# Patient Record
Sex: Male | Born: 1995 | Race: White | Hispanic: No | Marital: Single | Smoking: Former smoker
Health system: Southern US, Community
[De-identification: ages and names within clinical notes are randomized; demographics above are authoritative.]

## PROBLEM LIST (undated history)

## (undated) DIAGNOSIS — K219 Gastro-esophageal reflux disease without esophagitis: Secondary | ICD-10-CM

## (undated) DIAGNOSIS — F419 Anxiety disorder, unspecified: Secondary | ICD-10-CM

## (undated) DIAGNOSIS — F329 Major depressive disorder, single episode, unspecified: Secondary | ICD-10-CM

## (undated) DIAGNOSIS — G4700 Insomnia, unspecified: Secondary | ICD-10-CM

## (undated) DIAGNOSIS — N189 Chronic kidney disease, unspecified: Secondary | ICD-10-CM

## (undated) DIAGNOSIS — F32A Depression, unspecified: Secondary | ICD-10-CM

## (undated) DIAGNOSIS — N200 Calculus of kidney: Secondary | ICD-10-CM

## (undated) HISTORY — DX: Gastro-esophageal reflux disease without esophagitis: K21.9

## (undated) HISTORY — DX: Insomnia, unspecified: G47.00

## (undated) HISTORY — DX: Chronic kidney disease, unspecified: N18.9

---
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
## 2017-02-03 ENCOUNTER — Encounter: Payer: Self-pay | Admitting: Emergency Medicine

## 2017-02-03 ENCOUNTER — Other Ambulatory Visit: Payer: Self-pay

## 2017-02-03 ENCOUNTER — Emergency Department
Admission: EM | Admit: 2017-02-03 | Discharge: 2017-02-03 | Disposition: A | Discharge status: Home / Self Care | Payer: Self-pay | Attending: Emergency Medicine | Admitting: Emergency Medicine

## 2017-02-03 DIAGNOSIS — Z79899 Other long term (current) drug therapy: Secondary | ICD-10-CM | POA: Insufficient documentation

## 2017-02-03 DIAGNOSIS — L0501 Pilonidal cyst with abscess: Secondary | ICD-10-CM | POA: Insufficient documentation

## 2017-02-03 DIAGNOSIS — N189 Chronic kidney disease, unspecified: Secondary | ICD-10-CM | POA: Insufficient documentation

## 2017-02-03 DIAGNOSIS — F17210 Nicotine dependence, cigarettes, uncomplicated: Secondary | ICD-10-CM | POA: Insufficient documentation

## 2017-02-03 MED ORDER — CEPHALEXIN 500 MG PO CAPS
500.0000 mg | ORAL_CAPSULE | Freq: Once | ORAL | Status: AC
  Administered 2017-02-03: 500 mg via ORAL
  Filled 2017-02-03: qty 1

## 2017-02-03 MED ORDER — OXYCODONE-ACETAMINOPHEN 5-325 MG PO TABS: 1.0000 | ORAL_TABLET | ORAL | 0 refills | Status: DC | PRN

## 2017-02-03 MED ORDER — LIDOCAINE HCL (PF) 1 % IJ SOLN
INTRAMUSCULAR | Status: AC
  Administered 2017-02-03: 05:00:00
  Filled 2017-02-03: qty 5

## 2017-02-03 MED ORDER — OXYCODONE-ACETAMINOPHEN 5-325 MG PO TABS
1.0000 | ORAL_TABLET | Freq: Once | ORAL | Status: AC
  Administered 2017-02-03: 1 via ORAL
  Filled 2017-02-03: qty 1

## 2017-02-03 MED ORDER — SULFAMETHOXAZOLE-TRIMETHOPRIM 800-160 MG PO TABS: 1.0000 | ORAL_TABLET | Freq: Two times a day (BID) | ORAL | 0 refills | Status: DC

## 2017-02-03 NOTE — ED Notes (Signed)
Pt states that for past four days his tailbone has been hurting. He has no clue why. Family at bedside.

## 2017-02-03 NOTE — ED Provider Notes (Signed)
[facility] Emergency Department Provider Note    [name] Initiated Contact with Patient 02/03/17 0221     (approximate)  I have reviewed the triage vital signs and the nursing notes.   HISTORY  Chief Complaint Tailbone Pain    HPI Jimmy Richards is a 22 y.o. male low list of chronic medical conditions presents to the emergency department with nontraumatic "tailbone pain" times approximately 4 days.  Patient denies any fever.  Patient denies any history of bilateral cysts or abscess.     Past Medical History:  Diagnosis Date  . Chronic kidney disease     Patient Active Problem List   Diagnosis Date Noted  . Severe episode of recurrent major depressive disorder, without psychotic features (HCC) 09/04/2015  . Generalized anxiety disorder 09/04/2015  . PTSD (post-traumatic stress disorder) 09/04/2015  . Cannabis use disorder, severe, dependence (HCC) 09/04/2015  . Adolescent idiopathic scoliosis of thoracic region 11/10/2014  . Recurrent oral herpes simplex 11/10/2014    History reviewed. No pertinent surgical history.  Prior to Admission medications   Medication Sig Start Date End Date Taking? Authorizing Provider  busPIRone (BUSPAR) 10 MG tablet Take 10 mg by mouth 3 (three) times daily. 09/09/15   Provider, Historical, Jimmy Richards  mirtazapine (REMERON) 30 MG tablet Take 30 mg by mouth. 09/09/15   Provider, Historical, Jimmy Richards  oxyCODONE-acetaminophen (ROXICET) 5-325 MG tablet Take 1 tablet by mouth every 4 (four) hours as needed for severe pain. 02/03/17   Jimmy Richards, Jimmy Richards  QUEtiapine (SEROQUEL) 100 MG tablet Take 100 mg by mouth at bedtime. 09/09/15   Provider, Historical, Jimmy Richards  sulfamethoxazole-trimethoprim (BACTRIM DS,SEPTRA DS) 800-160 MG tablet Take 1 tablet by mouth 2 (two) times daily. 02/03/17   Jimmy Richards, Jimmy Richards    Allergies Patient has no known allergies.  Family History  Problem Relation Age of Onset  . Anxiety disorder Mother   .  Depression Mother   . Anxiety disorder Father   . Depression Father   . Anxiety disorder Sister   . Depression Sister   . Anxiety disorder Brother   . Depression Brother   . Anxiety disorder Sister   . Depression Sister   . Anxiety disorder Sister   . Depression Sister   . Drug abuse Sister   . Anxiety disorder Sister   . Depression Sister   . Anxiety disorder Sister   . Depression Sister   . Anxiety disorder Sister   . Depression Sister   . Anxiety disorder Sister   . Depression Sister   . Anxiety disorder Sister   . Depression Sister     Social History Social History   Tobacco Use  . Smoking status: Richards Every Day Smoker    Packs/day: 1.00    Years: 10.00    Pack years: 10.00    Types: Cigarettes  . Smokeless tobacco: Never Used  Substance Use Topics  . Alcohol use: Yes    Alcohol/week: 0.0 - 2.4 oz  . Drug use: Yes    Types: Marijuana    Comment: LAST USED LAST NIGHT    Review of Systems Constitutional: No fever/chills Eyes: No visual changes. ENT: No sore throat. Cardiovascular: Denies chest pain. Respiratory: Denies shortness of breath. Gastrointestinal: No abdominal pain.  No nausea, no vomiting.  No diarrhea.  No constipation. Genitourinary: Negative for dysuria. Musculoskeletal: Negative for neck pain.  Negative for back pain.  Positive for "tailbone" pain Integumentary: Negative for rash. Neurological: Negative for headaches, focal weakness  or numbness.   ____________________________________________   PHYSICAL EXAM:  VITAL SIGNS: ED Triage Vitals  Enc Vitals Group     BP 02/03/17 0135 123/78     Pulse Rate 02/03/17 0135 88     Resp 02/03/17 0135 16     Temp 02/03/17 0135 97.7 F (36.5 C)     Temp Source 02/03/17 0135 Oral     SpO2 02/03/17 0135 99 %     Weight 02/03/17 0133 68 kg (150 lb)     Height 02/03/17 0133 1.829 m (6')     Head Circumference --      Peak Flow --      Pain Score 02/03/17 0133 8     Pain Loc --      Pain  Edu? --      Excl. in GC? --     Constitutional: Alert and oriented. Well appearing and in no acute distress. Eyes: Conjunctivae are normal.  Head: Atraumatic. Mouth/Throat: Mucous membranes are moist.  Oropharynx non-erythematous. Neck: No stridor.   Cardiovascular: Normal rate, regular rhythm. Good peripheral circulation. Grossly normal heart sounds. Respiratory: Normal respiratory effort.  No retractions. Lungs CTAB. Gastrointestinal: Soft and nontender. No distention.  Musculoskeletal: No lower extremity tenderness nor edema. No gross deformities of extremities.  Indurated area noted with overlying erythema consistent with pilonidal cyst/abscess tender to palpation Neurologic:  Normal speech and language. No gross focal neurologic deficits are appreciated.  Skin:  Skin is warm, dry and intact. No rash noted. Psychiatric: Anxious affect.     Marland Kitchen..Incision and Drainage Date/Time: 02/03/2017 6:50 AM Performed by: Jimmy CurrentBrown, Jimmy Richards, Jimmy Richards Authorized by: Jimmy CurrentBrown, Jimmy Richards, Jimmy Richards   Consent:    Consent obtained:  Verbal   Consent given by:  Patient   Risks discussed:  Incomplete drainage, bleeding and pain   Alternatives discussed:  Alternative treatment Location:    Type:  Abscess   Location:  Anogenital   Anogenital location:  Pilonidal Pre-procedure details:    Skin preparation:  Betadine Anesthesia (see MAR for exact dosages):    Anesthesia method:  Topical application and local infiltration   Topical anesthetic:  Benzocaine gel   Local anesthetic:  Lidocaine 1% w/o epi Procedure type:    Complexity:  Simple Procedure details:    Needle aspiration: no     Incision types:  Cruciate   Scalpel blade:  11   Drainage:  Purulent and serosanguinous   Drainage amount:  Scant   Packing materials:  None Post-procedure details:    Patient tolerance of procedure:  Tolerated well, no immediate complications     ____________________________________________   INITIAL IMPRESSION /  ASSESSMENT AND PLAN / ED COURSE  As part of my medical decision making, I reviewed the following data within the electronic MEDICAL RECORD [age] year old male presenting to the emergency department above-stated history and physical exam consistent with pilonidal cyst/abscess.  I&D performed with scant pus/serosanguineous fluid expressed ____________________________________________  FINAL CLINICAL IMPRESSION(S) / ED DIAGNOSES  Final diagnoses:  Pilonidal abscess     MEDICATIONS GIVEN DURING THIS VISIT:  Medications  cephALEXin (KEFLEX) capsule 500 mg (500 mg Oral Given 02/03/17 0335)  oxyCODONE-acetaminophen (PERCOCET/ROXICET) 5-325 MG per tablet 1 tablet (1 tablet Oral Given 02/03/17 0335)  lidocaine (PF) (XYLOCAINE) 1 % injection (  Given 02/03/17 0454)     ED Discharge Orders        Ordered    sulfamethoxazole-trimethoprim (BACTRIM DS,SEPTRA DS) 800-160 MG tablet  2 times daily     02/03/17 0502  oxyCODONE-acetaminophen (ROXICET) 5-325 MG tablet  Every 4 hours PRN     02/03/17 0502       Note:  This document was prepared using Dragon voice recognition software and may include unintentional dictation errors.    Jimmy Richards, Jimmy Richards 02/03/17 [phone]

## 2017-02-03 NOTE — ED Triage Notes (Signed)
Patient ambulatory to triage with steady gait, without difficulty or distress noted; pt c/o pain to tailbone; denies any injury, denies any abscess, denies hx of same, denies any accomp symptoms

## 2017-09-13 ENCOUNTER — Emergency Department
Admission: EM | Admit: 2017-09-13 | Discharge: 2017-09-13 | Disposition: A | Discharge status: Home / Self Care | Payer: Self-pay | Attending: Emergency Medicine | Admitting: Emergency Medicine

## 2017-09-13 DIAGNOSIS — K2901 Acute gastritis with bleeding: Secondary | ICD-10-CM

## 2017-09-13 DIAGNOSIS — R1110 Vomiting, unspecified: Secondary | ICD-10-CM | POA: Insufficient documentation

## 2017-09-13 DIAGNOSIS — F17210 Nicotine dependence, cigarettes, uncomplicated: Secondary | ICD-10-CM | POA: Insufficient documentation

## 2017-09-13 DIAGNOSIS — F10920 Alcohol use, unspecified with intoxication, uncomplicated: Secondary | ICD-10-CM

## 2017-09-13 DIAGNOSIS — T5191XA Toxic effect of unspecified alcohol, accidental (unintentional), initial encounter: Secondary | ICD-10-CM | POA: Insufficient documentation

## 2017-09-13 HISTORY — DX: Anxiety disorder, unspecified: F41.9

## 2017-09-13 HISTORY — DX: Major depressive disorder, single episode, unspecified: F32.9

## 2017-09-13 HISTORY — DX: Depression, unspecified: F32.A

## 2017-09-13 LAB — COMPREHENSIVE METABOLIC PANEL
ALBUMIN: 5 g/dL (ref 3.5–5.0)
ALT: 17 U/L (ref 0–44)
AST: 21 U/L (ref 15–41)
Alkaline Phosphatase: 50 U/L (ref 38–126)
Anion gap: 11 (ref 5–15)
BUN: 16 mg/dL (ref 6–20)
CHLORIDE: 103 mmol/L (ref 98–111)
CO2: 27 mmol/L (ref 22–32)
CREATININE: 0.66 mg/dL (ref 0.61–1.24)
Calcium: 9.4 mg/dL (ref 8.9–10.3)
GFR calc non Af Amer: >60 mL/min (ref >60)
GLUCOSE: 102 mg/dL — AB (ref 70–99)
Potassium: 3.5 mmol/L (ref 3.5–5.1)
SODIUM: 141 mmol/L (ref 135–145)
Total Bilirubin: 1 mg/dL (ref 0.3–1.2)
Total Protein: 8.5 g/dL — ABNORMAL HIGH (ref 6.5–8.1)

## 2017-09-13 LAB — CBC WITH DIFFERENTIAL/PLATELET
BASOS ABS: 0.1 10*3/uL (ref 0–0.1)
BASOS PCT: 1 %
EOS ABS: 0 10*3/uL (ref 0–0.7)
Eosinophils Relative: 1 %
HCT: 48.4 % (ref 40.0–52.0)
HEMOGLOBIN: 16.8 g/dL (ref 13.0–18.0)
Lymphocytes Relative: 17 %
Lymphs Abs: 1.1 10*3/uL (ref 1.0–3.6)
MCH: 31.4 pg (ref 26.0–34.0)
MCHC: 34.6 g/dL (ref 32.0–36.0)
MCV: 90.8 fL (ref 80.0–100.0)
Monocytes Absolute: 0.8 10*3/uL (ref 0.2–1.0)
Monocytes Relative: 12 %
NEUTROS PCT: 69 %
Neutro Abs: 4.3 10*3/uL (ref 1.4–6.5)
Platelets: 223 10*3/uL (ref 150–440)
RBC: 5.33 MIL/uL (ref 4.40–5.90)
RDW: 14.4 % (ref 11.5–14.5)
WBC: 6.2 10*3/uL (ref 3.8–10.6)

## 2017-09-13 LAB — ETHANOL: ALCOHOL ETHYL (B): 76 mg/dL — AB (ref <10)

## 2017-09-13 MED ORDER — PANTOPRAZOLE SODIUM 40 MG PO TBEC
40.0000 mg | DELAYED_RELEASE_TABLET | Freq: Every day | ORAL | Status: DC
  Administered 2017-09-13: 40 mg via ORAL
  Filled 2017-09-13: qty 1

## 2017-09-13 MED ORDER — SODIUM CHLORIDE 0.9 % IV BOLUS
1000.0000 mL | Freq: Once | INTRAVENOUS | Status: AC
  Administered 2017-09-13: 1000 mL via INTRAVENOUS

## 2017-09-13 MED ORDER — OMEPRAZOLE 40 MG PO CPDR: 40.0000 mg | DELAYED_RELEASE_CAPSULE | Freq: Every day | ORAL | 0 refills | Status: DC

## 2017-09-13 MED ORDER — ONDANSETRON HCL 4 MG/2ML IJ SOLN
4.0000 mg | Freq: Once | INTRAMUSCULAR | Status: AC
  Administered 2017-09-13: 4 mg via INTRAVENOUS
  Filled 2017-09-13: qty 2

## 2017-09-13 NOTE — ED Triage Notes (Signed)
Patient to ED via [facility] c/o nausea and vomiting induced by EtOH. Per EMS patient was at home drinking moonshine last night; patient reports waking up still feeling drunk and has thrown up "twice" and saw "streaks of blood". Patient is alert and oriented x4, denies pain, respirations even and unlabored, in no acute distress at this time.

## 2017-09-13 NOTE — ED Notes (Signed)
Patient reports feeling "much better" and denies pain and nausea at this time. MD Schaevitz made aware.

## 2017-09-13 NOTE — ED Provider Notes (Signed)
[facility] Emergency Department Provider Note ____________________________________________   First MD Initiated Contact with Patient 09/13/17 [phone]     (approximate)  I have reviewed the triage vital signs and the nursing notes.   HISTORY  Chief Complaint Nausea and Emesis  HPI Jimmy Richards is a 22 y.o. male with a history of depression who was presented to the emergency department today complaining of 2 episodes of vomiting.  He says the vomitus was dark red and appeared like blood.  He says that he took 6 shots of moonshine last night which he thinks precipitated his vomiting today.  He is denying any abdominal pain but says that he still feels nauseous and drunk.  EMS brought him in.  No vomiting with EMS.  Past Medical History:  Diagnosis Date  . Anxiety   . Chronic kidney disease   . Depression     Patient Active Problem List   Diagnosis Date Noted  . Severe episode of recurrent major depressive disorder, without psychotic features (HCC) 09/04/2015  . Generalized anxiety disorder 09/04/2015  . PTSD (post-traumatic stress disorder) 09/04/2015  . Cannabis use disorder, severe, dependence (HCC) 09/04/2015  . Adolescent idiopathic scoliosis of thoracic region 11/10/2014  . Recurrent oral herpes simplex 11/10/2014    History reviewed. No pertinent surgical history.  Prior to Admission medications   Medication Sig Start Date End Date Taking? Authorizing Provider  busPIRone (BUSPAR) 10 MG tablet Take 10 mg by mouth 3 (three) times daily. 09/09/15   [provider]  mirtazapine (REMERON) 30 MG tablet Take 30 mg by mouth. 09/09/15   [provider]  oxyCODONE-acetaminophen (ROXICET) 5-325 MG tablet Take 1 tablet by mouth every 4 (four) hours as needed for severe pain. 02/03/17   Darci CurrentBrown, [place] N, MD  QUEtiapine (SEROQUEL) 100 MG tablet Take 100 mg by mouth at bedtime. 09/09/15   [provider]  sulfamethoxazole-trimethoprim  (BACTRIM DS,SEPTRA DS) 800-160 MG tablet Take 1 tablet by mouth 2 (two) times daily. 02/03/17   Darci CurrentBrown, [place] N, MD    Allergies Patient has no known allergies.  Family History  Problem Relation Age of Onset  . Anxiety disorder Mother   . Depression Mother   . Anxiety disorder Father   . Depression Father   . Anxiety disorder Sister   . Depression Sister   . Anxiety disorder Brother   . Depression Brother   . Anxiety disorder Sister   . Depression Sister   . Anxiety disorder Sister   . Depression Sister   . Drug abuse Sister   . Anxiety disorder Sister   . Depression Sister   . Anxiety disorder Sister   . Depression Sister   . Anxiety disorder Sister   . Depression Sister   . Anxiety disorder Sister   . Depression Sister   . Anxiety disorder Sister   . Depression Sister     Social History Social History   Tobacco Use  . Smoking status: Current Every Day Smoker    Packs/day: 1.00    Years: 10.00    Pack years: 10.00    Types: Cigarettes  . Smokeless tobacco: Never Used  Substance Use Topics  . Alcohol use: Yes    Alcohol/week: 0.0 - 4.0 standard drinks    Comment: occassionally  . Drug use: Yes    Types: Marijuana    Comment: LAST USED LAST NIGHT    Review of Systems  Constitutional: No fever/chills Eyes: No visual changes. ENT: No sore throat.  Cardiovascular: Denies chest pain. Respiratory: Denies shortness of breath. Gastrointestinal: No abdominal pain.  No diarrhea.  No constipation. Genitourinary: Negative for dysuria. Musculoskeletal: Negative for back pain. Skin: Negative for rash. Neurological: Negative for headaches, focal weakness or numbness.   ____________________________________________   PHYSICAL EXAM:  VITAL SIGNS: ED Triage Vitals  Enc Vitals Group     BP 09/13/17 0835 127/87     Pulse Rate 09/13/17 0835 75     Resp 09/13/17 0835 16     Temp 09/13/17 0835 98.1 F (36.7 C)     Temp Source 09/13/17 0835 Oral     SpO2  09/13/17 0835 100 %     Weight 09/13/17 0836 150 lb (68 kg)     Height 09/13/17 0836 5\' 11"  (1.803 m)     Head Circumference --      Peak Flow --      Pain Score 09/13/17 0836 0     Pain Loc --      Pain Edu? --      Excl. in GC? --     Constitutional: Alert and oriented. in no acute distress. Eyes: Conjunctivae are normal.  Head: Atraumatic. Nose: No congestion/rhinnorhea. Mouth/Throat: Mucous membranes are moist.  Neck: No stridor.   Cardiovascular: Normal rate, regular rhythm. Grossly normal heart sounds.   Respiratory: Normal respiratory effort.  No retractions. Lungs CTAB. Gastrointestinal: Soft and nontender. No distention. Musculoskeletal: No lower extremity tenderness nor edema.  No joint effusions. Neurologic:  Normal speech and language. No gross focal neurologic deficits are appreciated. Skin:  Skin is warm, dry and intact. No rash noted. Psychiatric: Mood and affect are normal. Speech and behavior are normal.  ____________________________________________   LABS (all labs ordered are listed, but only abnormal results are displayed)  Labs Reviewed  ETHANOL - Abnormal; Notable for the following components:      Result Value   Alcohol, Ethyl (B) 76 (*)    All other components within normal limits  COMPREHENSIVE METABOLIC PANEL - Abnormal; Notable for the following components:   Glucose, Bld 102 (*)    Total Protein 8.5 (*)    All other components within normal limits  CBC WITH DIFFERENTIAL/PLATELET   ____________________________________________  EKG   ____________________________________________  RADIOLOGY   ____________________________________________   PROCEDURES  Procedure(s) performed:   Procedures  Critical Care performed:   ____________________________________________   INITIAL IMPRESSION / ASSESSMENT AND PLAN / ED COURSE  Pertinent labs & imaging results that were available during my care of the patient were reviewed by me and  considered in my medical decision making (see chart for details).  DDX: Kidney failure, intoxication, nausea vomiting, gastritis, upper GI bleeding As part of my medical decision making, I reviewed the following data within the electronic MEDICAL RECORD NUMBER Notes from prior ED visits  ----------------------------------------- 10:50 AM on 09/13/2017 -----------------------------------------  Patient at this time feeling much improved.  No vomiting in the emergency department.  Able to ambulate without issue.  Now tolerating p.o. fluids as well as crackers.  Reassuring lab work.  Likely gastritis with possible bleeding with the vomiting but likely secondary to irritation from the moonshine he had last night.  He will be discharged with omeprazole.  He is understanding of the diagnosis as well as treatment plan and willing to comply.  Says that he drinks infrequently.  Clinically sober at this time. ____________________________________________   FINAL CLINICAL IMPRESSION(S) / ED DIAGNOSES  Alcohol toxic agent.  Vomiting.  NEW MEDICATIONS STARTED DURING THIS VISIT:  New Prescriptions   No medications on file     Note:  This document was prepared using Dragon voice recognition software and may include unintentional dictation errors.     Myrna Blazer, MD 09/13/17 1051

## 2017-10-13 ENCOUNTER — Encounter: Payer: Self-pay | Admitting: Emergency Medicine

## 2017-10-13 ENCOUNTER — Emergency Department
Admission: EM | Admit: 2017-10-13 | Discharge: 2017-10-13 | Disposition: A | Discharge status: Home / Self Care | Payer: Self-pay | Attending: Emergency Medicine | Admitting: Emergency Medicine

## 2017-10-13 ENCOUNTER — Other Ambulatory Visit: Payer: Self-pay

## 2017-10-13 DIAGNOSIS — F17210 Nicotine dependence, cigarettes, uncomplicated: Secondary | ICD-10-CM | POA: Insufficient documentation

## 2017-10-13 DIAGNOSIS — Z79899 Other long term (current) drug therapy: Secondary | ICD-10-CM | POA: Insufficient documentation

## 2017-10-13 DIAGNOSIS — B349 Viral infection, unspecified: Secondary | ICD-10-CM | POA: Insufficient documentation

## 2017-10-13 DIAGNOSIS — N189 Chronic kidney disease, unspecified: Secondary | ICD-10-CM | POA: Insufficient documentation

## 2017-10-13 DIAGNOSIS — R109 Unspecified abdominal pain: Secondary | ICD-10-CM | POA: Insufficient documentation

## 2017-10-13 LAB — URINALYSIS, COMPLETE (UACMP) WITH MICROSCOPIC
BACTERIA UA: NONE SEEN
Bilirubin Urine: NEGATIVE
GLUCOSE, UA: NEGATIVE mg/dL
KETONES UR: 20 mg/dL — AB
Leukocytes, UA: NEGATIVE
Nitrite: NEGATIVE
PH: 7 (ref 5.0–8.0)
PROTEIN: NEGATIVE mg/dL
Specific Gravity, Urine: 1.023 (ref 1.005–1.030)

## 2017-10-13 LAB — INFLUENZA PANEL BY PCR (TYPE A & B)
INFLAPCR: NEGATIVE
Influenza B By PCR: NEGATIVE

## 2017-10-13 MED ORDER — GUAIFENESIN-CODEINE 100-10 MG/5ML PO SYRP: 5.0000 mL | ORAL_SOLUTION | Freq: Three times a day (TID) | ORAL | 0 refills | Status: DC | PRN

## 2017-10-13 MED ORDER — IBUPROFEN 600 MG PO TABS
600.0000 mg | ORAL_TABLET | Freq: Once | ORAL | Status: AC
  Administered 2017-10-13: 600 mg via ORAL
  Filled 2017-10-13: qty 1

## 2017-10-13 MED ORDER — HYDROCOD POLST-CPM POLST ER 10-8 MG/5ML PO SUER
5.0000 mL | Freq: Once | ORAL | Status: AC
  Administered 2017-10-13: 5 mL via ORAL
  Filled 2017-10-13: qty 5

## 2017-10-13 MED ORDER — IBUPROFEN 600 MG PO TABS: 600.0000 mg | ORAL_TABLET | Freq: Four times a day (QID) | ORAL | 0 refills | Status: DC | PRN

## 2017-10-13 NOTE — Discharge Instructions (Signed)
Please follow up with primary care if not improving over the next few days.  Return to the ER for symptoms that change or worsen if unable to schedule an appointment. 

## 2017-10-13 NOTE — ED Provider Notes (Signed)
[facility] Emergency Department Provider Note  ____________________________________________  Time seen: Approximately 7:31 PM  I have reviewed the triage vital signs and the nursing notes.   HISTORY  Chief Complaint Generalized Body Aches and Fever   HPI Avraham Benish is a 22 y.o. male who presents to the emergency department for treatment and evaluation of body aches, fever, headache, congestion, fatigue that has been worsening over the day.   Patient states that he started a new job yesterday and is unsure if he was exposed to anyone else who is ill.  He has taken Aleve without any improvement of his symptoms.   Past Medical History:  Diagnosis Date  . Anxiety   . Chronic kidney disease   . Depression     Patient Active Problem List   Diagnosis Date Noted  . Severe episode of recurrent major depressive disorder, without psychotic features (HCC) 09/04/2015  . Generalized anxiety disorder 09/04/2015  . PTSD (post-traumatic stress disorder) 09/04/2015  . Cannabis use disorder, severe, dependence (HCC) 09/04/2015  . Adolescent idiopathic scoliosis of thoracic region 11/10/2014  . Recurrent oral herpes simplex 11/10/2014    History reviewed. No pertinent surgical history.  Prior to Admission medications   Medication Sig Start Date End Date Taking? Authorizing Provider  busPIRone (BUSPAR) 10 MG tablet Take 10 mg by mouth 3 (three) times daily. 09/09/15   [provider]  guaiFENesin-codeine (ROBITUSSIN AC) 100-10 MG/5ML syrup Take 5 mLs by mouth 3 (three) times daily as needed for cough. 10/13/17   Brittanee Ghazarian, Rulon Eisenmenger B, FNP  ibuprofen (ADVIL,MOTRIN) 600 MG tablet Take 1 tablet (600 mg total) by mouth every 6 (six) hours as needed. 10/13/17   Garen Woolbright, Rulon Eisenmenger B, FNP  mirtazapine (REMERON) 30 MG tablet Take 30 mg by mouth. 09/09/15   [provider]  omeprazole (PRILOSEC) 40 MG capsule Take 1 capsule (40 mg total) by mouth daily for 14 days.  09/13/17 09/27/17  Schaevitz, Myra Rude, MD  oxyCODONE-acetaminophen (ROXICET) 5-325 MG tablet Take 1 tablet by mouth every 4 (four) hours as needed for severe pain. 02/03/17   Darci Current, MD  QUEtiapine (SEROQUEL) 100 MG tablet Take 100 mg by mouth at bedtime. 09/09/15   [provider]  sulfamethoxazole-trimethoprim (BACTRIM DS,SEPTRA DS) 800-160 MG tablet Take 1 tablet by mouth 2 (two) times daily. 02/03/17   Darci Current, MD    Allergies Patient has no known allergies.  Family History  Problem Relation Age of Onset  . Anxiety disorder Mother   . Depression Mother   . Anxiety disorder Father   . Depression Father   . Anxiety disorder Sister   . Depression Sister   . Anxiety disorder Brother   . Depression Brother   . Anxiety disorder Sister   . Depression Sister   . Anxiety disorder Sister   . Depression Sister   . Drug abuse Sister   . Anxiety disorder Sister   . Depression Sister   . Anxiety disorder Sister   . Depression Sister   . Anxiety disorder Sister   . Depression Sister   . Anxiety disorder Sister   . Depression Sister   . Anxiety disorder Sister   . Depression Sister     Social History Social History   Tobacco Use  . Smoking status: Current Every Day Smoker    Packs/day: 1.00    Years: 10.00    Pack years: 10.00    Types: Cigarettes  . Smokeless tobacco: Never Used  Substance  Use Topics  . Alcohol use: Yes    Alcohol/week: 0.0 - 4.0 standard drinks    Comment: occassionally  . Drug use: Yes    Types: Marijuana    Comment: LAST USED LAST NIGHT    Review of Systems Constitutional: Positive for fever/chills ENT: Positive for sore throat. Cardiovascular: Denies chest pain. Respiratory: Negative for shortness of breath.  Positive for cough. Gastrointestinal: Negative for nausea, no vomiting.  No diarrhea.  Musculoskeletal: Positive for body aches, specifically flank pain. Skin: Negative for rash. Neurological: Positive for  headaches ____________________________________________   PHYSICAL EXAM:  VITAL SIGNS: ED Triage Vitals  Enc Vitals Group     BP 10/13/17 1859 127/62     Pulse Rate 10/13/17 1859 (!) 109     Resp 10/13/17 1859 18     Temp 10/13/17 1859 99.4 F (37.4 C)     Temp Source 10/13/17 1859 Oral     SpO2 10/13/17 1859 100 %     Weight 10/13/17 1900 149 lb 14.6 oz (68 kg)     Height 10/13/17 1900 5\' 11"  (1.803 m)     Head Circumference --      Peak Flow --      Pain Score 10/13/17 1900 7     Pain Loc --      Pain Edu? --      Excl. in GC? --     Constitutional: Alert and oriented.  Acutely ill appearing and in no acute distress. Eyes: Conjunctivae are normal. EOMI. Ears: Bilateral tympanic membranes are mildly injected but without erythema. Nose: Sinus congestion noted; no rhinnorhea. Mouth/Throat: Mucous membranes are moist.  Oropharynx erythematous. Tonsils 1+ without exudate. Neck: No stridor.  Lymphatic: No cervical lymphadenopathy. Cardiovascular: Normal rate, regular rhythm. Good peripheral circulation. Respiratory: Normal respiratory effort.  No retractions.  Breath sounds are clear to auscultation throughout. Gastrointestinal: Soft and nontender.  Musculoskeletal: FROM x 4 extremities.  Neurologic:  Normal speech and language.  Skin:  Skin is warm, dry and intact. No rash noted. Psychiatric: Mood and affect are normal. Speech and behavior are normal.  ____________________________________________   LABS (all labs ordered are listed, but only abnormal results are displayed)  Labs Reviewed  URINALYSIS, COMPLETE (UACMP) WITH MICROSCOPIC - Abnormal; Notable for the following components:      Result Value   Color, Urine YELLOW (*)    APPearance HAZY (*)    Hgb urine dipstick SMALL (*)    Ketones, ur 20 (*)    All other components within normal limits  INFLUENZA PANEL BY PCR (TYPE A & B)   ____________________________________________  EKG  Not  indicated ____________________________________________  RADIOLOGY  Not indicated ____________________________________________   PROCEDURES  Procedure(s) performed: None  Critical Care performed: No ____________________________________________   INITIAL IMPRESSION / ASSESSMENT AND PLAN / ED COURSE  22 y.o. male who presents to the emergency department for treatment and evaluation of symptoms most consistent with a viral illness.  He also has some flank pain.  We will do an influenza screen as well as urinalysis.   ----------------------------------------- 8:59 PM on 10/13/2017 -----------------------------------------  Patient to be discharged home after a negative influenza screening.  He does have some ketones, hemoglobin and amorphous crystals in the urinalysis, but it is unlikely that he has obstructive renal calculi and bilateral kidneys.  He is resting comfortably on the bed at this time.  He will be treated for viral URI symptoms and will be advised to rotate Tylenol and ibuprofen if needed for  pain.  He was given a referral to see [facility] for symptoms that are not improving over the next few days.  He is to return to the emergency department for symptoms of change or worsen if unable to schedule appointment.   Medications  chlorpheniramine-HYDROcodone (TUSSIONEX) 10-8 MG/5ML suspension 5 mL (has no administration in time range)  ibuprofen (ADVIL,MOTRIN) tablet 600 mg (has no administration in time range)    ED Discharge Orders         Ordered    guaiFENesin-codeine (ROBITUSSIN AC) 100-10 MG/5ML syrup  3 times daily PRN     10/13/17 2056    ibuprofen (ADVIL,MOTRIN) 600 MG tablet  Every 6 hours PRN     10/13/17 2056           Pertinent labs & imaging results that were available during my care of the patient were reviewed by me and considered in my medical decision making (see chart for details).    If controlled substance prescribed during  this visit, 12 month history viewed on the [facility] prior to issuing an initial prescription for Schedule II or III opiod. ____________________________________________   FINAL CLINICAL IMPRESSION(S) / ED DIAGNOSES  Final diagnoses:  Viral illness  Flank pain    Note:  This document was prepared using Dragon voice recognition software and may include unintentional dictation errors.     Chinita Pester, FNP 10/13/17 2104    Phineas Semen, MD 10/13/17 2141

## 2017-10-13 NOTE — ED Triage Notes (Signed)
Patient reports body aches and headache with head congestion starting last night. Reports fever at home. Denies being around anyone sick.

## 2017-10-13 NOTE — ED Notes (Signed)
Pt states that symptoms started last night and have been getting worse. Symptoms include Body pain, fever, HA, congested, and fatigue. Family at bedside.

## 2017-11-15 ENCOUNTER — Encounter: Payer: Self-pay | Admitting: Emergency Medicine

## 2017-11-15 ENCOUNTER — Emergency Department
Admission: EM | Admit: 2017-11-15 | Discharge: 2017-11-15 | Disposition: A | Discharge status: Other Acute Inpatient Hospital | Payer: Self-pay | Attending: Emergency Medicine | Admitting: Emergency Medicine

## 2017-11-15 ENCOUNTER — Inpatient Hospital Stay
Admission: AD | Admit: 2017-11-15 | Discharge: 2017-11-21 | DRG: 885 | Disposition: A | Discharge status: Home / Self Care | Payer: No Typology Code available for payment source | Source: Intra-hospital | Attending: Psychiatry | Admitting: Psychiatry

## 2017-11-15 DIAGNOSIS — F1220 Cannabis dependence, uncomplicated: Secondary | ICD-10-CM | POA: Diagnosis present

## 2017-11-15 DIAGNOSIS — Z818 Family history of other mental and behavioral disorders: Secondary | ICD-10-CM

## 2017-11-15 DIAGNOSIS — T50901A Poisoning by unspecified drugs, medicaments and biological substances, accidental (unintentional), initial encounter: Secondary | ICD-10-CM | POA: Diagnosis present

## 2017-11-15 DIAGNOSIS — Z716 Tobacco abuse counseling: Secondary | ICD-10-CM | POA: Diagnosis not present

## 2017-11-15 DIAGNOSIS — R45851 Suicidal ideations: Secondary | ICD-10-CM | POA: Diagnosis present

## 2017-11-15 DIAGNOSIS — Z8659 Personal history of other mental and behavioral disorders: Secondary | ICD-10-CM | POA: Diagnosis present

## 2017-11-15 DIAGNOSIS — F410 Panic disorder [episodic paroxysmal anxiety] without agoraphobia: Secondary | ICD-10-CM | POA: Diagnosis present

## 2017-11-15 DIAGNOSIS — Z915 Personal history of self-harm: Secondary | ICD-10-CM | POA: Diagnosis not present

## 2017-11-15 DIAGNOSIS — Z9119 Patient's noncompliance with other medical treatment and regimen: Secondary | ICD-10-CM

## 2017-11-15 DIAGNOSIS — Z813 Family history of other psychoactive substance abuse and dependence: Secondary | ICD-10-CM

## 2017-11-15 DIAGNOSIS — F4310 Post-traumatic stress disorder, unspecified: Secondary | ICD-10-CM | POA: Diagnosis present

## 2017-11-15 DIAGNOSIS — F329 Major depressive disorder, single episode, unspecified: Secondary | ICD-10-CM | POA: Insufficient documentation

## 2017-11-15 DIAGNOSIS — F17210 Nicotine dependence, cigarettes, uncomplicated: Secondary | ICD-10-CM | POA: Insufficient documentation

## 2017-11-15 DIAGNOSIS — F332 Major depressive disorder, recurrent severe without psychotic features: Secondary | ICD-10-CM | POA: Diagnosis present

## 2017-11-15 DIAGNOSIS — F411 Generalized anxiety disorder: Secondary | ICD-10-CM | POA: Diagnosis present

## 2017-11-15 DIAGNOSIS — Z91410 Personal history of adult physical and sexual abuse: Secondary | ICD-10-CM | POA: Diagnosis not present

## 2017-11-15 DIAGNOSIS — Z79899 Other long term (current) drug therapy: Secondary | ICD-10-CM | POA: Insufficient documentation

## 2017-11-15 DIAGNOSIS — Z87891 Personal history of nicotine dependence: Secondary | ICD-10-CM | POA: Diagnosis present

## 2017-11-15 DIAGNOSIS — F1291 Cannabis use, unspecified, in remission: Secondary | ICD-10-CM | POA: Diagnosis present

## 2017-11-15 DIAGNOSIS — N189 Chronic kidney disease, unspecified: Secondary | ICD-10-CM | POA: Insufficient documentation

## 2017-11-15 DIAGNOSIS — F17200 Nicotine dependence, unspecified, uncomplicated: Secondary | ICD-10-CM | POA: Diagnosis present

## 2017-11-15 DIAGNOSIS — G4700 Insomnia, unspecified: Secondary | ICD-10-CM | POA: Diagnosis present

## 2017-11-15 DIAGNOSIS — F419 Anxiety disorder, unspecified: Secondary | ICD-10-CM | POA: Insufficient documentation

## 2017-11-15 DIAGNOSIS — F325 Major depressive disorder, single episode, in full remission: Secondary | ICD-10-CM | POA: Diagnosis present

## 2017-11-15 LAB — CBC
HCT: 50.5 % (ref 39.0–52.0)
Hemoglobin: 16.3 g/dL (ref 13.0–17.0)
MCH: 30.7 pg (ref 26.0–34.0)
MCHC: 32.3 g/dL (ref 30.0–36.0)
MCV: 95.1 fL (ref 80.0–100.0)
NRBC: 0 % (ref 0.0–0.2)
Platelets: 230 10*3/uL (ref 150–400)
RBC: 5.31 MIL/uL (ref 4.22–5.81)
RDW: 13.7 % (ref 11.5–15.5)
WBC: 7.3 10*3/uL (ref 4.0–10.5)

## 2017-11-15 LAB — COMPREHENSIVE METABOLIC PANEL
ALT: 16 U/L (ref 0–44)
ANION GAP: 6 (ref 5–15)
AST: 17 U/L (ref 15–41)
Albumin: 4.7 g/dL (ref 3.5–5.0)
Alkaline Phosphatase: 55 U/L (ref 38–126)
BUN: 10 mg/dL (ref 6–20)
CALCIUM: 9.7 mg/dL (ref 8.9–10.3)
CHLORIDE: 104 mmol/L (ref 98–111)
CO2: 29 mmol/L (ref 22–32)
Creatinine, Ser: 0.81 mg/dL (ref 0.61–1.24)
GFR calc non Af Amer: >60 mL/min (ref >60)
Glucose, Bld: 124 mg/dL — ABNORMAL HIGH (ref 70–99)
POTASSIUM: 3.3 mmol/L — AB (ref 3.5–5.1)
SODIUM: 139 mmol/L (ref 135–145)
Total Bilirubin: 0.8 mg/dL (ref 0.3–1.2)
Total Protein: 7.9 g/dL (ref 6.5–8.1)

## 2017-11-15 LAB — URINE DRUG SCREEN, QUALITATIVE (ARMC ONLY)
Amphetamines, Ur Screen: NOT DETECTED
BARBITURATES, UR SCREEN: NOT DETECTED
Benzodiazepine, Ur Scrn: NOT DETECTED
CANNABINOID 50 NG, UR ~~LOC~~: POSITIVE — AB
Cocaine Metabolite,Ur ~~LOC~~: NOT DETECTED
MDMA (ECSTASY) UR SCREEN: NOT DETECTED
Methadone Scn, Ur: NOT DETECTED
Opiate, Ur Screen: NOT DETECTED
Phencyclidine (PCP) Ur S: NOT DETECTED
TRICYCLIC, UR SCREEN: NOT DETECTED

## 2017-11-15 LAB — ACETAMINOPHEN LEVEL

## 2017-11-15 LAB — ETHANOL: Alcohol, Ethyl (B): <10 mg/dL (ref <10)

## 2017-11-15 LAB — SALICYLATE LEVEL: Salicylate Lvl: <7 mg/dL (ref 2.8–30.0)

## 2017-11-15 NOTE — ED Provider Notes (Signed)
[facility] Emergency Department Provider Note ____________________________________________   First MD Initiated Contact with Patient 11/15/17 2048     (approximate)  I have reviewed the triage vital signs and the nursing notes.   HISTORY  Chief Complaint Suicidal    HPI Jimmy Richards is a 22 y.o. male with PMH as noted below who presents with an apparent suicide attempt earlier today.  The patient states that around 3 PM he took 3000 mg of ibuprofen.  The patient states that he was feeling suicidal at this time but is not any longer.  He states he has had prior suicide attempts and was last hospitalized psychiatrically in 2017.  He reports that he is prescribed medications but has not been taking them.  He denies any acute medical complaints.  Past Medical History:  Diagnosis Date  . Anxiety   . Chronic kidney disease   . Depression     Patient Active Problem List   Diagnosis Date Noted  . Severe episode of recurrent major depressive disorder, without psychotic features (HCC) 09/04/2015  . Generalized anxiety disorder 09/04/2015  . PTSD (post-traumatic stress disorder) 09/04/2015  . Cannabis use disorder, severe, dependence (HCC) 09/04/2015  . Adolescent idiopathic scoliosis of thoracic region 11/10/2014  . Recurrent oral herpes simplex 11/10/2014    History reviewed. No pertinent surgical history.  Prior to Admission medications   Medication Sig Start Date End Date Taking? Authorizing Provider  busPIRone (BUSPAR) 10 MG tablet Take 10 mg by mouth 3 (three) times daily. 09/09/15   [provider]  guaiFENesin-codeine (ROBITUSSIN AC) 100-10 MG/5ML syrup Take 5 mLs by mouth 3 (three) times daily as needed for cough. 10/13/17   Triplett, Rulon Eisenmenger B, FNP  ibuprofen (ADVIL,MOTRIN) 600 MG tablet Take 1 tablet (600 mg total) by mouth every 6 (six) hours as needed. 10/13/17   Triplett, Rulon Eisenmenger B, FNP  mirtazapine (REMERON) 30 MG tablet Take 30 mg by  mouth. 09/09/15   [provider]  omeprazole (PRILOSEC) 40 MG capsule Take 1 capsule (40 mg total) by mouth daily for 14 days. 09/13/17 09/27/17  Schaevitz, Myra Rude, MD  oxyCODONE-acetaminophen (ROXICET) 5-325 MG tablet Take 1 tablet by mouth every 4 (four) hours as needed for severe pain. 02/03/17   Darci Current, MD  QUEtiapine (SEROQUEL) 100 MG tablet Take 100 mg by mouth at bedtime. 09/09/15   [provider]  sulfamethoxazole-trimethoprim (BACTRIM DS,SEPTRA DS) 800-160 MG tablet Take 1 tablet by mouth 2 (two) times daily. 02/03/17   Darci Current, MD    Allergies Patient has no known allergies.  Family History  Problem Relation Age of Onset  . Anxiety disorder Mother   . Depression Mother   . Anxiety disorder Father   . Depression Father   . Anxiety disorder Sister   . Depression Sister   . Anxiety disorder Brother   . Depression Brother   . Anxiety disorder Sister   . Depression Sister   . Anxiety disorder Sister   . Depression Sister   . Drug abuse Sister   . Anxiety disorder Sister   . Depression Sister   . Anxiety disorder Sister   . Depression Sister   . Anxiety disorder Sister   . Depression Sister   . Anxiety disorder Sister   . Depression Sister   . Anxiety disorder Sister   . Depression Sister     Social History Social History   Tobacco Use  . Smoking status: Current Every Day Smoker  Packs/day: 1.00    Years: 10.00    Pack years: 10.00    Types: Cigarettes  . Smokeless tobacco: Never Used  Substance Use Topics  . Alcohol use: Yes    Alcohol/week: 0.0 - 4.0 standard drinks    Comment: occassionally  . Drug use: Yes    Types: Marijuana    Comment: LAST USED LAST NIGHT    Review of Systems  Constitutional: No fever. Eyes: No redness. ENT: No sore throat. Cardiovascular: Denies chest pain. Respiratory: Denies shortness of breath. Gastrointestinal: No nausea or vomiting.  Genitourinary: Negative for flank pain.    Musculoskeletal: Negative for back pain. Skin: Negative for rash. Neurological: Negative for headache.   ____________________________________________   PHYSICAL EXAM:  VITAL SIGNS: ED Triage Vitals  Enc Vitals Group     BP 11/15/17 1946 122/74     Pulse Rate 11/15/17 1946 71     Resp 11/15/17 1946 20     Temp 11/15/17 1946 98.5 F (36.9 C)     Temp Source 11/15/17 1946 Oral     SpO2 11/15/17 1946 95 %     Weight 11/15/17 1948 126 lb (57.2 kg)     Height 11/15/17 1948 5\' 10"  (1.778 m)     Head Circumference --      Peak Flow --      Pain Score 11/15/17 1948 0     Pain Loc --      Pain Edu? --      Excl. in GC? --     Constitutional: Alert and oriented. Well appearing and in no acute distress. Eyes: Conjunctivae are normal.  Head: Atraumatic. Nose: No congestion/rhinnorhea. Mouth/Throat: Mucous membranes are moist.   Neck: Normal range of motion.  Cardiovascular:   Good peripheral circulation. Respiratory: Normal respiratory effort. Gastrointestinal: No distention.  Musculoskeletal: Extremities warm and well perfused.  Neurologic:  Normal speech and language. No gross focal neurologic deficits are appreciated.  Skin:  Skin is warm and dry. No rash noted. Psychiatric: Flat affect.  Calm and cooperative.  ____________________________________________   LABS (all labs ordered are listed, but only abnormal results are displayed)  Labs Reviewed  COMPREHENSIVE METABOLIC PANEL - Abnormal; Notable for the following components:      Result Value   Potassium 3.3 (*)    Glucose, Bld 124 (*)    All other components within normal limits  ACETAMINOPHEN LEVEL - Abnormal; Notable for the following components:   Acetaminophen (Tylenol), Serum <10 (*)    All other components within normal limits  URINE DRUG SCREEN, QUALITATIVE ([facility] ONLY) - Abnormal; Notable for the following components:   Cannabinoid 50 Ng, Ur [place] POSITIVE (*)    All other components within normal limits   ETHANOL  SALICYLATE LEVEL  CBC   ____________________________________________  EKG   ____________________________________________  RADIOLOGY    ____________________________________________   PROCEDURES  Procedure(s) performed: No  Procedures  Critical Care performed: No ____________________________________________   INITIAL IMPRESSION / ASSESSMENT AND PLAN / ED COURSE  Pertinent labs & imaging results that were available during my care of the patient were reviewed by me and considered in my medical decision making (see chart for details).  22 year old male presents for evaluation after an apparent suicide attempt today.  The patient took a deliberate overdose of ibuprofen.  He denies using any other medications today.  He also reports marijuana use.  He denies any acute medical complaints at this time.  He states he is not compliant with his psychiatric medications.  On exam, his vital signs are normal.  He is calm and cooperative.  He denies active SI at this moment.  He reports a prior history of suicide attempts and prior psychiatric hospitalization 2017.  We will obtain labs for medical clearance.  There is no indication for prolonged monitoring given the relatively benign nature of an ibuprofen overdose and the duration since he took the medication.  He is asymptomatic at this time.  The patient is under involuntary commitment.  When medically cleared, disposition will be per behavioral health evaluation.  ----------------------------------------- 11:10 PM on 11/15/2017 -----------------------------------------  Lab work-up is unremarkable.  The patient is medically cleared.  Per TTS, the patient will be admitted. ____________________________________________   FINAL CLINICAL IMPRESSION(S) / ED DIAGNOSES  Final diagnoses:  Suicidal ideation      NEW MEDICATIONS STARTED DURING THIS VISIT:  New Prescriptions   No medications on file     Note:  This  document was prepared using Dragon voice recognition software and may include unintentional dictation errors.    Dionne Bucy, MD 11/15/17 2311

## 2017-11-15 NOTE — ED Notes (Signed)
With male officer in attendance, pt removed green short sleeve shirt, grey sweatpants, black shoes--all placed in labeled pt belonging bag to be secured on nursing unit; pt changed in burgandy paper scrubs

## 2017-11-15 NOTE — ED Notes (Signed)
Report called to [employer] in [place]. Patient aware of plan of care. Patient currently IVC.

## 2017-11-15 NOTE — ED Notes (Signed)
Patient alert and oriented x4. Patient denies SI?HI/AVH. Patient states earlier in day he was feeling SI and hearing voices. Patient did not give any details on any events leading to feelings. Patient denies drinking alcohol or any drug use beside weed. Will continue to monitor.
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
## 2017-11-15 NOTE — Consult Note (Signed)
Spoke with TTS. Patient meets criteria for psychiatric admission. Orders entered. Thank you.

## 2017-11-15 NOTE — ED Triage Notes (Signed)
Pt in via [facility] from [facility] with c/o suicidal ideation.

## 2017-11-15 NOTE — ED Notes (Signed)

## 2017-11-15 NOTE — ED Triage Notes (Signed)
Pt reports took 3000mg  of ibuprofen at 1500 this afternoon.

## 2017-11-16 ENCOUNTER — Other Ambulatory Visit: Payer: Self-pay

## 2017-11-16 DIAGNOSIS — F325 Major depressive disorder, single episode, in full remission: Secondary | ICD-10-CM | POA: Diagnosis present

## 2017-11-16 DIAGNOSIS — F332 Major depressive disorder, recurrent severe without psychotic features: Secondary | ICD-10-CM | POA: Diagnosis present

## 2017-11-16 LAB — TSH: TSH: 0.744 u[IU]/mL (ref 0.350–4.500)

## 2017-11-16 LAB — LIPID PANEL
CHOL/HDL RATIO: 4.1 ratio
Cholesterol: 147 mg/dL (ref 0–200)
HDL: 36 mg/dL — AB (ref >40)
LDL CALC: 95 mg/dL (ref 0–99)
Triglycerides: 79 mg/dL (ref <150)
VLDL: 16 mg/dL (ref 0–40)

## 2017-11-16 LAB — HEMOGLOBIN A1C
Hgb A1c MFr Bld: 4.9 % (ref 4.8–5.6)
Mean Plasma Glucose: 93.93 mg/dL

## 2017-11-16 MED ORDER — QUETIAPINE FUMARATE 100 MG PO TABS
100.0000 mg | ORAL_TABLET | Freq: Every day | ORAL | Status: DC
  Administered 2017-11-16 (×2): 100 mg via ORAL
  Filled 2017-11-16 (×2): qty 1

## 2017-11-16 MED ORDER — PANTOPRAZOLE SODIUM 40 MG PO TBEC
40.0000 mg | DELAYED_RELEASE_TABLET | Freq: Every day | ORAL | Status: DC
  Administered 2017-11-16 – 2017-11-21 (×6): 40 mg via ORAL
  Filled 2017-11-16 (×6): qty 1

## 2017-11-16 MED ORDER — HYDROXYZINE HCL 50 MG PO TABS: 50.0000 mg | ORAL_TABLET | Freq: Three times a day (TID) | ORAL | Status: DC | PRN

## 2017-11-16 MED ORDER — ALUM & MAG HYDROXIDE-SIMETH 200-200-20 MG/5ML PO SUSP: 30.0000 mL | ORAL | Status: DC | PRN

## 2017-11-16 MED ORDER — ACETAMINOPHEN 325 MG PO TABS: 650.0000 mg | ORAL_TABLET | Freq: Four times a day (QID) | ORAL | Status: DC | PRN

## 2017-11-16 MED ORDER — BUSPIRONE HCL 10 MG PO TABS
10.0000 mg | ORAL_TABLET | Freq: Three times a day (TID) | ORAL | Status: DC
  Administered 2017-11-16 – 2017-11-21 (×15): 10 mg via ORAL
  Filled 2017-11-16 (×18): qty 1

## 2017-11-16 MED ORDER — TRAZODONE HCL 100 MG PO TABS
100.0000 mg | ORAL_TABLET | Freq: Every evening | ORAL | Status: DC | PRN
  Administered 2017-11-16: 100 mg via ORAL
  Filled 2017-11-16 (×2): qty 1

## 2017-11-16 MED ORDER — MAGNESIUM HYDROXIDE 400 MG/5ML PO SUSP: 30.0000 mL | Freq: Every day | ORAL | Status: DC | PRN

## 2017-11-16 MED ORDER — MIRTAZAPINE 15 MG PO TABS
30.0000 mg | ORAL_TABLET | Freq: Every day | ORAL | Status: DC
  Administered 2017-11-16 – 2017-11-20 (×6): 30 mg via ORAL
  Filled 2017-11-16 (×6): qty 2

## 2017-11-16 MED ORDER — NICOTINE 21 MG/24HR TD PT24
21.0000 mg | MEDICATED_PATCH | Freq: Every day | TRANSDERMAL | Status: DC
  Administered 2017-11-16 – 2017-11-21 (×6): 21 mg via TRANSDERMAL
  Filled 2017-11-16 (×5): qty 1

## 2017-11-16 MED ORDER — ENSURE ENLIVE PO LIQD
237.0000 mL | Freq: Three times a day (TID) | ORAL | Status: DC
  Administered 2017-11-16 – 2017-11-21 (×13): 237 mL via ORAL

## 2017-11-16 NOTE — Progress Notes (Signed)
D- Patient alert and oriented. Patient presents in a pleasant mood on assessment stating that he slept pretty good last night and had no major complaints to voice to this Clinical research associate. Patient rates both his depression/anxiety a "7/10" stating that "I don't know, it's normal now" and "being here" is why he is feeling this way. Patient denies SI, HI, AVH, and pain at this time. Patient has no stated goals for today.  A- Scheduled medications administered to patient, per MD orders. Support and encouragement provided.  Routine safety checks conducted every 15 minutes.  Patient informed to notify staff with problems or concerns.  R- No adverse drug reactions noted. Patient contracts for safety at this time. Patient compliant with medications and treatment plan. Patient receptive, calm, and cooperative. Patient interacts well with others on the unit.  Patient remains safe at this time.

## 2017-11-16 NOTE — Progress Notes (Signed)
Recreation Therapy Notes   Date: 11/16/2017  Time: 9:30 pm   Location: Craft Room   Behavioral response: N/A   Intervention Topic: Happiness  Discussion/Intervention: Patient did not attend group.   Clinical Observations/Feedback:  Patient did not attend group.   Kamilla Hands LRT/CTRS        Lynleigh Kovack 11/16/2017 11:17 AM

## 2017-11-16 NOTE — Progress Notes (Addendum)
[facility] MD Progress Note  11/17/2017 12:44 PM Kem Hensen  MRN:  [id number]  Subjective:   Mr. Zygmund met with treatment team today. He looks depressed with severe psychomotor retardation, poor eye contact and minimal answers. He is still suicidal. He denies side effects from medications.  Principal Problem: Major depressive disorder, recurrent severe without psychotic features ([place]) Diagnosis:   Patient Active Problem List   Diagnosis Date Noted  . Major depressive disorder, recurrent severe without psychotic features ([place]) [F33.2] 11/16/2017    Priority: High  . Overdose [T50.901A] 11/15/2017  . Tobacco use disorder [F17.200] 11/15/2017  . Generalized anxiety disorder [F41.1] 09/04/2015  . PTSD (post-traumatic stress disorder) [F43.10] 09/04/2015  . Cannabis use disorder, severe, dependence ([place]) [F12.20] 09/04/2015  . 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 Anxiety   . Chronic kidney disease   . Depression    History reviewed. No pertinent surgical history. Family History:  Family History  Problem Relation Age of Onset  . Anxiety disorder Mother   . Depression Mother   . Anxiety disorder Father   . Depression Father   . Anxiety disorder Sister   . Depression Sister   . Anxiety disorder Brother   . Depression Brother   . Anxiety disorder Sister   . Depression Sister   . Anxiety disorder Sister   . Depression Sister   . Drug abuse Sister   . Anxiety disorder Sister   . Depression Sister   . Anxiety disorder Sister   . Depression Sister   . Anxiety disorder Sister   . Depression Sister   . Anxiety disorder Sister   . Depression Sister   . Anxiety disorder Sister   . Depression Sister    Family Psychiatric  History:  depression Social History:  Social History   Substance and Sexual Activity  Alcohol Use Yes  . Alcohol/week: 0.0 - 4.0 standard drinks   Comment: occassionally     Social History   Substance and Sexual Activity  Drug Use Yes  . Types: Marijuana   Comment: Daily    Social History   Socioeconomic History  . Marital status: Single    Spouse name: Not on file  . Number of children: Not on file  . Years of education: Not on file  . Highest education level: Not on file  Occupational History  . Not on file  Social Needs  . Financial resource strain: Somewhat hard  . Food insecurity:    Worry: Never true    Inability: Never true  . Transportation needs:    Medical: No    Non-medical: No  Tobacco Use  . Smoking status: Current Every Day Smoker    Packs/day: 1.00    Years: 10.00    Pack years: 10.00    Types: Cigarettes  . Smokeless tobacco: Never Used  . Tobacco comment: refused  Substance and Sexual Activity  . Alcohol use: Yes    Alcohol/week: 0.0 - 4.0 standard drinks    Comment: occassionally  . Drug use: Yes    Types: Marijuana    Comment: Daily  . Sexual activity: Yes    Birth control/protection: None  Lifestyle  . Physical activity:    Days per week: 0 days    Minutes per session: 0 min  .  Stress: Rather much  Relationships  . Social connections:    Talks on phone: More than three times a week    Gets together: Never    Attends religious service: Never    Active member of club or organization: No    Attends meetings of clubs or organizations: Never    Relationship status: Never married  Other Topics Concern  . Not on file  Social History Narrative  . Not on file   Additional Social History:    Pain Medications: Denies Prescriptions: Denies Over the Counter: Denies History of alcohol / drug use?: Yes Longest period of sobriety (when/how long): unknown Negative Consequences of Use: Financial Withdrawal Symptoms: (none) Name of Substance 1:  Marijuana 1 - Age of First Use: unable to state 1 - Amount (size/oz): varies 1 - Frequency: daily 1 - Duration: unknown 1 - Last Use / Amount: 11/15/2017                  Sleep: Fair  Appetite:  Fair  Current Medications: Current Facility-Administered Medications  Medication Dose Route Frequency Provider Last Rate Last Dose  . acetaminophen (TYLENOL) tablet 650 mg  650 mg Oral Q6H PRN Liliani Bobo B, MD      . alum & mag hydroxide-simeth (MAALOX/MYLANTA) 200-200-20 MG/5ML suspension 30 mL  30 mL Oral Q4H PRN Phenix Vandermeulen B, MD      . busPIRone (BUSPAR) tablet 10 mg  10 mg Oral TID Ottavio Norem B, MD   10 mg at 11/17/17 1152  . feeding supplement (ENSURE ENLIVE) (ENSURE ENLIVE) liquid 237 mL  237 mL Oral TID BM Luevenia Mcavoy B, MD   237 mL at 11/16/17 2010  . hydrOXYzine (ATARAX/VISTARIL) tablet 50 mg  50 mg Oral TID PRN Merwin Breden B, MD      . magnesium hydroxide (MILK OF MAGNESIA) suspension 30 mL  30 mL Oral Daily PRN Rudolf Blizard B, MD      . mirtazapine (REMERON) tablet 30 mg  30 mg Oral QHS Dinesha Twiggs B, MD   30 mg at 11/16/17 2150  . nicotine (NICODERM CQ - dosed in mg/24 hours) patch 21 mg  21 mg Transdermal Q0600 Guerino Caporale B, MD   21 mg at 11/17/17 0814  . pantoprazole (PROTONIX) EC tablet 40 mg  40 mg Oral Daily Haya Hemler B, MD   40 mg at 11/17/17 0812  . QUEtiapine (SEROQUEL) tablet 100 mg  100 mg Oral QHS Donnie Gedeon B, MD   100 mg at 11/16/17 2150  . traZODone (DESYREL) tablet 100 mg  100 mg Oral QHS PRN Reise Hietala B, MD   100 mg at 11/16/17 0015    Lab Results:  Results for orders placed or performed during the hospital encounter of 11/15/17 (from the past 48 hour(s))  Hemoglobin A1c     Status: None   Collection Time: 11/15/17  8:03 PM  Result Value Ref Range   Hgb A1c MFr Bld 4.9 4.8 - 5.6 %    Comment: (NOTE) Pre diabetes:          5.7%-6.4% Diabetes:               >6.4% Glycemic control for   <7.0% adults with diabetes    Mean Plasma Glucose 93.93 mg/dL    Comment: Performed at [facility], [place] [address] [place], [place] [address]  Lipid panel     Status: Abnormal   Collection Time: 11/15/17  8:03 PM  Result  Value Ref Range   Cholesterol 147 0 - 200 mg/dL   Triglycerides 79 <150 mg/dL   HDL 36 (L) >40 mg/dL   Total CHOL/HDL Ratio 4.1 RATIO   VLDL 16 0 - 40 mg/dL   LDL Cholesterol 95 0 - 99 mg/dL    Comment:        Total Cholesterol/HDL:CHD Risk Coronary Heart Disease Risk Table                     Men   Women  1/2 Average Risk   3.4   3.3  Average Risk       5.0   4.4  2 X Average Risk   9.6   7.1  3 X Average Risk  23.4   11.0        Use the calculated Patient Ratio above and the CHD Risk Table to determine the patient's CHD Risk.        ATP III CLASSIFICATION (LDL):  <100     mg/dL   Optimal  100-129  mg/dL   Near or Above                    Optimal  130-159  mg/dL   Borderline  160-189  mg/dL   High  >190     mg/dL   Very High Performed at [facility], [address] [place], [place] [address]   TSH     Status: None   Collection Time: 11/15/17  8:03 PM  Result Value Ref Range   TSH 0.744 0.350 - 4.500 uIU/mL    Comment: Performed by a 3rd Generation assay with a functional sensitivity of <=0.01 uIU/mL. Performed at [facility], [address] [place], [place] [address]     Blood Alcohol level:  Lab Results  Component Value Date   ETH <10 11/15/2017   ETH 76 (H) 29/92/4268    Metabolic Disorder Labs: Lab Results  Component Value Date   HGBA1C 4.9 11/15/2017   MPG 93.93 11/15/2017   No results found for: PROLACTIN Lab Results  Component Value Date   CHOL 147 11/15/2017   TRIG 79 11/15/2017   HDL 36 (L) 11/15/2017   CHOLHDL 4.1 11/15/2017   VLDL 16 11/15/2017   LDLCALC 95 11/15/2017    Physical Findings: AIMS:  , ,  ,  ,    CIWA:    COWS:     Musculoskeletal: Strength  & Muscle Tone: within normal limits Gait & Station: normal Patient leans: N/A  Psychiatric Specialty Exam: Physical Exam  Nursing note and vitals reviewed. Psychiatric: His affect is blunt. His speech is delayed. He is slowed and withdrawn. Cognition and memory are impaired. He expresses impulsivity. He exhibits a depressed mood. He expresses suicidal ideation.    Review of Systems  Neurological: Negative.   Psychiatric/Behavioral: Positive for depression and suicidal ideas. The patient is nervous/anxious and has insomnia.   All other systems reviewed and are negative.   Blood pressure 120/79, pulse 67, temperature 97.8 F (36.6 C), temperature source Oral, resp. rate 16, height 5' 10"  (1.778 m), weight 57.6 kg, SpO2 100 %.Body mass index is 18.22 kg/m.  General Appearance: Fairly Groomed  Eye Contact:  Minimal  Speech:  Slow  Volume:  Decreased  Mood:  Depressed and Worthless  Affect:  Depressed  Thought Process:  Goal Directed and Descriptions of Associations: Intact  Orientation:  Full (Time, Place, and Person)  Thought Content:  WDL  Suicidal Thoughts:  Yes.  with intent/plan  Homicidal Thoughts:  No  Memory:  Immediate;   Fair Recent;   Fair Remote;   Fair  Judgement:  Poor  Insight:  Shallow  Psychomotor Activity:  Psychomotor Retardation  Concentration:  Concentration: Fair and Attention Span: Fair  Recall:  [employer] of Knowledge:  Fair  Language:  Fair  Akathisia:  No  Handed:  Right  AIMS (if indicated):     Assets:  Communication Skills Desire for Improvement Housing Physical Health Resilience Social Support  ADL's:  Intact  Cognition:  WNL  Sleep:  Number of Hours: 7.25     Treatment Plan Summary: Daily contact with patient to assess and evaluate symptoms and progress in treatment and Medication management   Mr. Barradas is a 22 year old male with a history of severe depression admitted after overdose on Motrin in the context of relationship  problems and treatment noncomplinace.  #Suicidal ideation -patient able to contract for safety in the hospital  #Mood -restarted Remeron 30 mg nightly -increase Seroquel 200 mg nightly  -consider discontinuation of BuSpar 10 mg TID -add Effexor 150 mg daily  #Insomnia -stop Trazodone 100 mg nightly PRN -give Restoril 15 mg nightly  #Weight loss -Ensure TID  #Cannabis abuse -minimizes problems and declines treatment  #Overdose -denies any GI symptoms -continue Protonix 40 mg daily  #Smoking cessation -nicotine patch is available  #Labs -lipid panel, TSH, A1C -EKG  #Disposition -discharge to home with the mother -follow up with [facility]   Orson Slick, MD 11/17/2017, 12:44 PM
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
## 2017-11-16 NOTE — H&P (Addendum)
Psychiatric Admission Assessment Adult  Patient Identification: Jimmy Richards MRN:  [id number] Date of Evaluation:  11/16/2017 Chief Complaint:  Depression Principal Diagnosis: Major depressive disorder, recurrent severe without psychotic features (HCC) Diagnosis:   Patient Active Problem List   Diagnosis Date Noted  . Major depressive disorder, recurrent severe without psychotic features (HCC) [F33.2] 11/16/2017    Priority: High  . Overdose [T50.901A] 11/15/2017  . Tobacco use disorder [F17.200] 11/15/2017  . Generalized anxiety disorder [F41.1] 09/04/2015  . PTSD (post-traumatic stress disorder) [F43.10] 09/04/2015  . Cannabis use disorder, severe, dependence (HCC) [F12.20] 09/04/2015  . 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 Mr. Glinski is a 22 year old male with a history of depression.   Chief complaint. "I'm tired."  History of present illness. Information was obtained from the patient and the chart. The patient was brought to the ER after intentional overdose on Ibuprofen 3000 mg in the context of relationship problems after his girlfriend broke up with him. He has a long history of depression that has gotten worse since he stopped taking his medications several months ago. He reports poor sleep, decreased appetite with weight loss, anhedonia, feeling of hopelessness worthlessness and guilt, poor energy and concentration, social isolation, crying spells that culminated in overdose that was impulsive. He denies psychotic symptoms or high anxiety. He has been smoking cannabis but no alcohol or other drugs.   The patient is not engaging in the interview. He is in a fetal position and dose not make any effort to maintain eye contact.  Past psychiatric history. Depression as long as he can remember. Hospitalized twice before as adult for overdose  and suicidal ideation. Responded well to Remeron, Seroquel and BuSpar.  Family psychiatric history. Brother with depression.  Social history. Dropped out of high school in the ninth grade "did not like school". Lives with his mother. Just quit a job at the [employer] due to [employer].   Total Time spent with patient: 1 hour  Is the patient at risk to self? Yes.    Has the patient been a risk to self in the past 6 months? No.  Has the patient been a risk to self within the distant past? Yes.    Is the patient a risk to others? No.  Has the patient been a risk to others in the past 6 months? No.  Has the patient been a risk to others within the distant past? No.   Prior Inpatient Therapy:   Prior Outpatient Therapy:    Alcohol Screening: 1. How often do you have a drink containing alcohol?: Monthly or less 2. How many drinks containing alcohol do you have on a typical day when you are drinking?: 1 or 2 3. How often do you have six or more drinks on one occasion?: Never AUDIT-C Score: 1 4. How often during the last year have you found that you were not able to stop drinking once you had started?: Never 5. How often during the last year have you failed to do what was normally expected from you becasue of drinking?: Never 6. How often during the last year have you needed a first drink in the morning to get yourself going after a heavy drinking session?: Never 7. How often during the last year have you had a feeling of guilt of remorse after drinking?: Never 8. How often during the last year have you  been unable to remember what happened the night before because you had been drinking?: Never 9. Have you or someone else been injured as a result of your drinking?: No 10. Has a relative or friend or a doctor or another health worker been concerned about your drinking or suggested you cut down?: No Alcohol Use Disorder Identification Test Final Score (AUDIT): 1 Intervention/Follow-up: AUDIT  Score <7 follow-up not indicated Substance Abuse History in the last 12 months:  Yes.   Consequences of Substance Abuse: Negative Previous Psychotropic Medications: Yes  Psychological Evaluations: No  Past Medical History:  Past Medical History:  Diagnosis Date  . Anxiety   . Chronic kidney disease   . Depression    History reviewed. No pertinent surgical history. Family History:  Family History  Problem Relation Age of Onset  . Anxiety disorder Mother   . Depression Mother   . Anxiety disorder Father   . Depression Father   . Anxiety disorder Sister   . Depression Sister   . Anxiety disorder Brother   . Depression Brother   . Anxiety disorder Sister   . Depression Sister   . Anxiety disorder Sister   . Depression Sister   . Drug abuse Sister   . Anxiety disorder Sister   . Depression Sister   . Anxiety disorder Sister   . Depression Sister   . Anxiety disorder Sister   . Depression Sister   . Anxiety disorder Sister   . Depression Sister   . Anxiety disorder Sister   . Depression Sister     Tobacco Screening: Have you used any form of tobacco in the last 30 days? (Cigarettes, Smokeless Tobacco, Cigars, and/or Pipes): Yes Tobacco use, Select all that apply: 5 or more cigarettes per day Are you interested in Tobacco Cessation Medications?: Yes, will notify MD for an order Counseled patient on smoking cessation including recognizing danger situations, developing coping skills and basic information about quitting provided: Refused/Declined practical counseling Social History:  Social History   Substance and Sexual Activity  Alcohol Use Yes  . Alcohol/week: 0.0 - 4.0 standard drinks   Comment: occassionally     Social History   Substance and Sexual Activity  Drug Use Yes  . Types: Marijuana   Comment: Daily    Additional Social History: Marital status: Single Are you sexually active?: No What is your sexual orientation?: heterosexual Has your sexual  activity been affected by drugs, alcohol, medication, or emotional stress?: no Does patient have children?: No    Pain Medications: Denies Prescriptions: Denies Over the Counter: Denies History of alcohol / drug use?: Yes Longest period of sobriety (when/how long): unknown Negative Consequences of Use: Financial Withdrawal Symptoms: (none) Name of Substance 1: Marijuana 1 - Age of First Use: unable to state 1 - Amount (size/oz): varies 1 - Frequency: daily 1 - Duration: unknown 1 - Last Use / Amount: 11/15/2017                  Allergies:  No Known Allergies Lab Results:  Results for orders placed or performed during the hospital encounter of 11/15/17 (from the past 48 hour(s))  Hemoglobin A1c     Status: None   Collection Time: 11/15/17  8:03 PM  Result Value Ref Range   Hgb A1c MFr Bld 4.9 4.8 - 5.6 %    Comment: (NOTE) Pre diabetes:          5.7%-6.4% Diabetes:              >  6.4% Glycemic control for   <7.0% adults with diabetes    Mean Plasma Glucose 93.93 mg/dL    Comment: Performed at [facility], [address], [place], [place] [address]  Lipid panel     Status: Abnormal   Collection Time: 11/15/17  8:03 PM  Result Value Ref Range   Cholesterol 147 0 - 200 mg/dL   Triglycerides 79 <604 mg/dL   HDL 36 (L) >54 mg/dL   Total CHOL/HDL Ratio 4.1 RATIO   VLDL 16 0 - 40 mg/dL   LDL Cholesterol 95 0 - 99 mg/dL    Comment:        Total Cholesterol/HDL:CHD Risk Coronary Heart Disease Risk Table                     Men   Women  1/2 Average Risk   3.4   3.3  Average Risk       5.0   4.4  2 X Average Risk   9.6   7.1  3 X Average Risk  23.4   11.0        Use the calculated Patient Ratio above and the CHD Risk Table to determine the patient's CHD Risk.        ATP III CLASSIFICATION (LDL):  <100     mg/dL   Optimal  098-119  mg/dL   Near or Above                    Optimal  130-159  mg/dL   Borderline  147-829  mg/dL   High  >562     mg/dL   Very  High Performed at [facility], [address] [place], [place] [address]   TSH     Status: None   Collection Time: 11/15/17  8:03 PM  Result Value Ref Range   TSH 0.744 0.350 - 4.500 uIU/mL    Comment: Performed by a 3rd Generation assay with a functional sensitivity of <=0.01 uIU/mL. Performed at [facility], [address] [place], [place] [address]     Blood Alcohol level:  Lab Results  Component Value Date   ETH <10 11/15/2017   ETH 76 (H) 09/13/2017    Metabolic Disorder Labs:  Lab Results  Component Value Date   HGBA1C 4.9 11/15/2017   MPG 93.93 11/15/2017   No results found for: PROLACTIN Lab Results  Component Value Date   CHOL 147 11/15/2017   TRIG 79 11/15/2017   HDL 36 (L) 11/15/2017   CHOLHDL 4.1 11/15/2017   VLDL 16 11/15/2017   LDLCALC 95 11/15/2017    Current Medications: Current Facility-Administered Medications  Medication Dose Route Frequency Provider Last Rate Last Dose  . acetaminophen (TYLENOL) tablet 650 mg  650 mg Oral Q6H PRN Nikaya Nasby B, MD      . alum & mag hydroxide-simeth (MAALOX/MYLANTA) 200-200-20 MG/5ML suspension 30 mL  30 mL Oral Q4H PRN Makenzey Nanni B, MD      . busPIRone (BUSPAR) tablet 10 mg  10 mg Oral TID Jakell Trusty B, MD   10 mg at 11/16/17 1124  . feeding supplement (ENSURE ENLIVE) (ENSURE ENLIVE) liquid 237 mL  237 mL Oral TID BM Numa Schroeter B, MD      . hydrOXYzine (ATARAX/VISTARIL) tablet 50 mg  50 mg Oral TID PRN Jacub Waiters B, MD      . magnesium hydroxide (MILK OF MAGNESIA) suspension 30 mL  30 mL Oral  Daily PRN Ayano Douthitt B, MD      . mirtazapine (REMERON) tablet 30 mg  30 mg Oral QHS Samil Mecham B, MD   30 mg at 11/16/17 0016  . nicotine (NICODERM CQ - dosed in mg/24 hours) patch 21 mg  21 mg Transdermal Q0600 Natividad Halls B, MD   21 mg at 11/16/17 1124  . pantoprazole (PROTONIX) EC tablet 40 mg  40 mg Oral Daily Jillene Wehrenberg  B, MD   40 mg at 11/16/17 1125  . QUEtiapine (SEROQUEL) tablet 100 mg  100 mg Oral QHS Nick Stults B, MD   100 mg at 11/16/17 0015  . traZODone (DESYREL) tablet 100 mg  100 mg Oral QHS PRN Ronny Ruddell B, MD   100 mg at 11/16/17 0015   PTA Medications: Medications Prior to Admission  Medication Sig Dispense Refill Last Dose  . busPIRone (BUSPAR) 10 MG tablet Take 10 mg by mouth 3 (three) times daily.   Taking  . mirtazapine (REMERON) 30 MG tablet Take 30 mg by mouth.   Taking  . omeprazole (PRILOSEC) 40 MG capsule Take 1 capsule (40 mg total) by mouth daily for 14 days. 14 capsule 0   . QUEtiapine (SEROQUEL) 100 MG tablet Take 100 mg by mouth at bedtime.   Taking    Musculoskeletal: Strength & Muscle Tone: within normal limits Gait & Station: normal Patient leans: N/A  Psychiatric Specialty Exam: I reviewed physical exam performed in the ER and agree with the findings. Physical Exam  Nursing note and vitals reviewed. Psychiatric: His affect is blunt. His speech is delayed. He is slowed and withdrawn. Cognition and memory are impaired. He expresses impulsivity. He exhibits a depressed mood. He expresses suicidal ideation.    Review of Systems  Constitutional: Positive for malaise/fatigue and weight loss.  Neurological: Negative.   Psychiatric/Behavioral: Positive for depression, substance abuse and suicidal ideas. The patient has insomnia.   All other systems reviewed and are negative.   Blood pressure 110/71, pulse 68, temperature 97.7 F (36.5 C), temperature source Oral, resp. rate 16, height 5\' 10"  (1.778 m), weight 57.6 kg, SpO2 100 %.Body mass index is 18.22 kg/m.  See SRA                                                  Sleep:  Number of Hours: 4.5    Treatment Plan Summary: Daily contact with patient to assess and evaluate symptoms and progress in treatment and Medication management   Mr. Stanco is a 22 year old male with a history  of severe depression admitted after overdose on Motrin in the context of relationship problems and treatment noncomplinace.  #Suicidal ideation -patient able to contract for safety in the hospital  #Mood -restarted Remeron 30 mg nightly -Seroquel 100 mg nightly  -BuSpar 10 mg TID  #Insomnia -stop Trazodone 100 mg nightly PRN -give Restoril 15 mg nightly  #Weight loss -Ensure TID  #Cannabis abuse -minimizes problems and declines treatment  #Overdose -denies any GI symptoms -continue Protonix 40 mg daily  #Smoking cessation -nicotine patch is available  #Labs -lipid panel, TSH, A1C -EKG  #Disposition -discharge to home with the mother -follow up with [facility]   Observation Level/Precautions:  15 minute checks  Laboratory:  CBC Chemistry Profile UDS UA  Psychotherapy:    Medications:    Consultations:  Discharge Concerns:    Estimated LOS:  Other:     Physician Treatment Plan for Primary Diagnosis: Major depressive disorder, recurrent severe without psychotic features (HCC) Long Term Goal(s): Improvement in symptoms so as ready for discharge  Short Term Goals: Ability to identify changes in lifestyle to reduce recurrence of condition will improve, Ability to verbalize feelings will improve, Ability to disclose and discuss suicidal ideas, Ability to demonstrate self-control will improve, Ability to identify and develop effective coping behaviors will improve, Ability to maintain clinical measurements within normal limits will improve, Compliance with prescribed medications will improve and Ability to identify triggers associated with substance abuse/mental health issues will improve  Physician Treatment Plan for Secondary Diagnosis: Principal Problem:   Major depressive disorder, recurrent severe without psychotic features (HCC) Active Problems:   PTSD (post-traumatic stress disorder)   Cannabis use disorder, severe, dependence (HCC)   Tobacco use disorder  Long  Term Goal(s): Improvement in symptoms so as ready for discharge  Short Term Goals: Ability to identify changes in lifestyle to reduce recurrence of condition will improve, Ability to demonstrate self-control will improve and Ability to identify triggers associated with substance abuse/mental health issues will improve  I certify that inpatient services furnished can reasonably be expected to improve the patient's condition.    Kristine Linea, MD 10/29/20192:21 PM

## 2017-11-16 NOTE — Tx Team (Signed)
Initial Treatment Plan 11/16/2017 12:49 AM Adair Patter ZOX:[id number]    PATIENT STRESSORS: Medication change or noncompliance Substance abuse   PATIENT STRENGTHS: General fund of knowledge Supportive family/friends   PATIENT IDENTIFIED PROBLEMS: Suicidal Ideation 11/16/2017  Depression 11/16/2017                   DISCHARGE CRITERIA:  Improved stabilization in mood, thinking, and/or behavior Motivation to continue treatment in a less acute level of care Verbal commitment to aftercare and medication compliance  PRELIMINARY DISCHARGE PLAN: Attend aftercare/continuing care group Return to previous living arrangement  PATIENT/FAMILY INVOLVEMENT: This treatment plan has been presented to and reviewed with the patient, Jimmy Richards, and/or family member.  The patient and family have been given the opportunity to ask questions and make suggestions.  Galen Manila, RN 11/16/2017, 12:49 AM

## 2017-11-16 NOTE — Plan of Care (Signed)
Patient verbalizes understanding of the general information that's been provided to him and all questions/concerns have been addressed and answered at this time. Patient has been present out in the milieu throughout the afternoon interacting well with staff and other members on the unit without any issues at this time. Patient denied SI/HI/AVH, however, he does endorse depression and anxiety rating them both a "7/10" stating to this writer that "I don't know, it's normal now" and "being here" is why he's feeling this way. Patient has demonstrated self-control on the unit and has the ability to verbalize his anger/frustrations in an appropriate manner. Patient has been in compliance with his prescribed therapeutic regimen. Patient has the ability to identify the available resources that can assist him in meeting his health-care needs, but has not voiced anything to this Clinical research associate as of yet. Patient is working to maintain his clinical measurements within normal limits. Patient remains safe on the unit at this time.   Problem: Education: Goal: Knowledge of [facility] Education information/materials will improve Outcome: Progressing Goal: Emotional status will improve Outcome: Progressing Goal: Mental status will improve Outcome: Progressing Goal: Verbalization of understanding the information provided will improve Outcome: Progressing   Problem: Activity: Goal: Interest or engagement in activities will improve Outcome: Progressing Goal: Sleeping patterns will improve Outcome: Progressing   Problem: Coping: Goal: Ability to verbalize frustrations and anger appropriately will improve Outcome: Progressing Goal: Ability to demonstrate self-control will improve Outcome: Progressing   Problem: Health Behavior/Discharge Planning: Goal: Identification of resources available to assist in meeting health care needs will improve Outcome: Progressing Goal: Compliance with treatment plan for  underlying cause of condition will improve Outcome: Progressing   Problem: Physical Regulation: Goal: Ability to maintain clinical measurements within normal limits will improve Outcome: Progressing   Problem: Safety: Goal: Periods of time without injury will increase Outcome: Progressing   Problem: Self-Concept: Goal: Ability to disclose and discuss suicidal ideas will improve Outcome: Progressing Goal: Will verbalize positive feelings about self Outcome: Progressing   Problem: Education: Goal: Knowledge of General Education information will improve Description Including pain rating scale, medication(s)/side effects and non-pharmacologic comfort measures Outcome: Progressing

## 2017-11-16 NOTE — Plan of Care (Signed)
New Admit  Problem: Education: Goal: Knowledge of [facility] Education information/materials will improve Outcome: Not Progressing Goal: Emotional status will improve Outcome: Not Progressing Goal: Mental status will improve Outcome: Not Progressing Goal: Verbalization of understanding the information provided will improve Outcome: Not Progressing   Problem: Activity: Goal: Interest or engagement in activities will improve Outcome: Not Progressing Goal: Sleeping patterns will improve Outcome: Not Progressing   Problem: Coping: Goal: Ability to verbalize frustrations and anger appropriately will improve Outcome: Not Progressing Goal: Ability to demonstrate self-control will improve Outcome: Not Progressing   Problem: Health Behavior/Discharge Planning: Goal: Identification of resources available to assist in meeting health care needs will improve Outcome: Not Progressing Goal: Compliance with treatment plan for underlying cause of condition will improve Outcome: Not Progressing   Problem: Physical Regulation: Goal: Ability to maintain clinical measurements within normal limits will improve Outcome: Not Progressing   Problem: Safety: Goal: Periods of time without injury will increase Outcome: Not Progressing   Problem: Self-Concept: Goal: Ability to disclose and discuss suicidal ideas will improve Outcome: Not Progressing Goal: Will verbalize positive feelings about self Outcome: Not Progressing

## 2017-11-16 NOTE — Progress Notes (Addendum)
Patient ID: Jimmy Richards, male   DOB: 10/10/1995, 22 y.o.   MRN: [id number] 22 year old male admitted via IVC for suicide attempt involving an overdose of 3000mg  of motrin. Reports states that patient broke up with girlfriend and that is what precipitated his overdose/SI. Patient denies this upon admission. Patient says he is unable to state why he did it. Reports that he has 3 previous attempts but does not want to go into it. Reports he has been diagnosed with depression and has spent time in a psychiatric inpatient facility but is unable to state when that occurred. Denies SI upon admission. Contracts verbally for safety while here. Denies HI/AVH. Endorses depression but is unable to state its origin. Reports being prescribed medications but has not been compliant for "a long time" due to running out and having no insurance. Reports he is unemployed and living with his mother. Reports no food insecurity and believes he will be able to obtain transportation to and from healthcare appointments if he is able to have a doctor. Reports no other social support aside from his girlfriend and mother. Would like for mother to be emergency contact but he does not know her number. Patient mood and affect is depressed upon approach. Eye contact is poor, speech is soft and difficult to hear, and patient is a poor historian. Minimally verbal and not able to give much detail when asked. Denies remarkable medical hx and denies kidney issues. Reports drinking ETOH only "on special occasions when someone offers it to me." Reports smoking cigarettes and marijuana daily. Requests nicotine patch. UDS +THC. Denies pain. Reports no appetite disturbance. Signed forms. Inventory and skin assessment completed. No contraband found. Oriented to unit and room. Provided hygiene supplies. Q 15 minute checks initiated. Will continue to monitor throughout the shift. Patient slept 4.5 hours. No apparent distress. Will endorse care to oncoming  shift.

## 2017-11-16 NOTE — [facility] Suicide Risk Assessment (Signed)
[facility] Admission Suicide Risk Assessment   Nursing information obtained from:    Demographic factors:  Male, Adolescent or young adult, Caucasian, Low socioeconomic status, Unemployed Current Mental Status:  Suicidal ideation indicated by patient Loss Factors:  Financial problems / change in socioeconomic status Historical Factors:  Prior suicide attempts Risk Reduction Factors:  Sense of responsibility to family, Living with another person, especially a relative  Total Time spent with patient: 1 hour Principal Problem: <principal problem not specified> Diagnosis:   Patient Active Problem List   Diagnosis Date Noted  . Severe episode of recurrent major depressive disorder, without psychotic features (HCC) [F33.2] 09/04/2015    Priority: High  . Major depressive disorder, recurrent severe without psychotic features (HCC) [F33.2] 11/16/2017  . Overdose [T50.901A] 11/15/2017  . Tobacco use disorder [F17.200] 11/15/2017  . Generalized anxiety disorder [F41.1] 09/04/2015  . PTSD (post-traumatic stress disorder) [F43.10] 09/04/2015  . Cannabis use disorder, severe, dependence (HCC) [F12.20] 09/04/2015  . 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  World Science writer [facility]). Score between 0-7:  no or low risk or alcohol related problems. Score between 8-15:  moderate risk of alcohol related problems. Score between 16-19:  high risk of alcohol related problems. Score 20 or above:  warrants further diagnostic evaluation for alcohol dependence and treatment.   CLINICAL FACTORS:   Depression:   Comorbid alcohol abuse/dependence Impulsivity Insomnia Alcohol/Substance  Abuse/Dependencies   Musculoskeletal: Strength & Muscle Tone: within normal limits Gait & Station: normal Patient leans: Backward  Psychiatric Specialty Exam: Physical Exam  Nursing note and vitals reviewed. Psychiatric: His affect is blunt. His speech is delayed. He is slowed and withdrawn. Cognition and memory are impaired. He expresses impulsivity. He exhibits a depressed mood. He expresses suicidal ideation.    Review of Systems  Constitutional: Positive for weight loss.  Neurological: Negative.   Psychiatric/Behavioral: Positive for depression, substance abuse and suicidal ideas. The patient has insomnia.   All other systems reviewed and are negative.   Blood pressure 110/71, pulse 68, temperature 97.7 F (36.5 C), temperature source Oral, resp. rate 16, height 5\' 10"  (1.778 m), weight 57.6 kg, SpO2 100 %.Body mass index is 18.22 kg/m.  General Appearance: Casual  Eye Contact:  Poor  Speech:  Slow  Volume:  Decreased  Mood:  Depressed  Affect:  Blunt  Thought Process:  Goal Directed and Descriptions of Associations: Intact  Orientation:  Full (Time, Place, and Person)  Thought Content:  WDL  Suicidal Thoughts:  Yes.  with intent/plan  Homicidal Thoughts:  No  Memory:  Immediate;   Fair Recent;   Fair Remote;   Fair  Judgement:  Poor  Insight:  Lacking  Psychomotor Activity:  Psychomotor Retardation  Concentration:  Concentration: Fair and Attention Span: Fair  Recall:  [employer] of Knowledge:  Fair  Language:  Fair  Akathisia:  No  Handed:  Right  AIMS (if indicated):     Assets:  Communication Skills Desire for Improvement Financial Resources/Insurance Housing Physical Health Resilience Social Support Transportation  ADL's:  Intact  Cognition:  WNL  Sleep:  Number of Hours: 4.5      COGNITIVE FEATURES THAT CONTRIBUTE TO RISK:  None    SUICIDE RISK:   Severe:  Frequent,  intense, and enduring suicidal ideation, specific plan, no subjective intent,  but some objective markers of intent (i.e., choice of lethal method), the method is accessible, some limited preparatory behavior, evidence of impaired self-control, severe dysphoria/symptomatology, multiple risk factors present, and few if any protective factors, particularly a lack of social support.  PLAN OF CARE: hospital admission, medication management, substance abuse counseling, discharge planning.   Mr. Nygaard is a 22 year old male with a history of severe depression admitted after overdose on Motrin in the context of relationship problems and treatment noncomplinace.  #Suicidal ideation -patient able to contract for safety in the hospital  #Mood -restarted Remeron 30 mg nightly -Seroquel 100 mg nightly  -BuSpar 10 mg TID -Trazodone 100 mg noghtly PRN  #Cannabis abuse -minimizes problems and declines treatment  #Overdose -denies any GI symptoms -continue Protonix 40 mg daily  #Smoking cessation -nicotine patch is available  #Labs -lipid panel, TSH, A1C -EKG  #Disposition -discharge to home with the mother -follow up with [facility]     I certify that inpatient services furnished can reasonably be expected to improve the patient's condition.   Kristine Linea, MD 11/16/2017, 1:45 PM

## 2017-11-17 MED ORDER — QUETIAPINE FUMARATE 200 MG PO TABS
200.0000 mg | ORAL_TABLET | Freq: Every day | ORAL | Status: DC
  Administered 2017-11-17 – 2017-11-20 (×4): 200 mg via ORAL
  Filled 2017-11-17 (×4): qty 1

## 2017-11-17 MED ORDER — VENLAFAXINE HCL ER 75 MG PO CP24
150.0000 mg | ORAL_CAPSULE | Freq: Every day | ORAL | Status: DC
  Administered 2017-11-17 – 2017-11-21 (×5): 150 mg via ORAL
  Filled 2017-11-17 (×5): qty 2

## 2017-11-17 NOTE — Progress Notes (Signed)
D- Patient alert and oriented. Patient presents in a pleasant mood on assessment stating that he slept alright last night, without voicing any major complaints to this Clinical research associate. Patient rated his depression a "6/10" stating that this is normal for him. Patient rates his anxiety a "5/10" stating to this writer "she wanted me to dig through the trash to get a plate". Patient denies SI, HI, AVH, and pain at this time stating to this writer that "I hear stuff sometimes, but not right now". Patient's goal for today is "thinking positive", in which he will "read a book" in order to accomplish this goal.   A- Scheduled medications administered to patient, per MD orders. Support and encouragement provided.  Routine safety checks conducted every 15 minutes.  Patient informed to notify staff with problems or concerns.  R- No adverse drug reactions noted. Patient contracts for safety at this time. Patient compliant with medications and treatment plan. Patient receptive, calm, and cooperative. Patient interacts well with others on the unit.  Patient remains safe at this time.

## 2017-11-17 NOTE — [facility] Group Notes (Signed)
[facility] Group Notes:  (Nursing/MHT/Case Management/Adjunct)  Date:  11/17/2017  Time:  9:43 PM  Type of Therapy:  Group Therapy  Participation Level:  Active  Participation Quality:  Appropriate  Affect:  Appropriate  Cognitive:  Alert  Insight:  Good  Engagement in Group:  Engaged  Modes of Intervention:  Support  Summary of Progress/Problems:  Jimmy Richards 11/17/2017, 9:43 PM

## 2017-11-17 NOTE — Progress Notes (Signed)
   11/17/17 1030  Clinical Encounter Type  Visited With Patient;Health care provider  Visit Type Initial;Spiritual support;Behavioral Health  Referral From Social work  Consult/Referral To Chaplain  Spiritual Encounters  Spiritual Needs Emotional;Other (Comment)   CH attended the patient's treatment team meeting. The patient appeared to be very depressed. He would not look up, only at the floor and stated that he did want to harm himself. The patient did state a goal of getting better and ate some breakfast this morning.

## 2017-11-17 NOTE — Progress Notes (Signed)
Patient was in bed awake upon the beginning of this shift. Was receptive upon assessment. Alert and oriented x4 and denying thoughts of self harm. Denying hallucinations and reported that he is improving "a little" in mood. Patient went to the dayroom per nursing encouragements. Watched TV; not engaged in conversations with peers, but pleasant. Had a snack and received bedtime medications. Patient did not want to engage in long conversations with this Clinical research associate. Was encouraged to talk to staff as needed. Currently in bed sleeping. Safety monitored per unit protocol.

## 2017-11-17 NOTE — Progress Notes (Signed)
Recreation Therapy Notes  Date: 11/17/2017  Time: 9:30 am  Location: Craft Room  Behavioral response: Appropriate    Intervention Topic: Communication  Discussion/Intervention:  Group content today was focused on communication. The group defined communication and ways to communicate with others. Individuals stated reason why communication is important and some reasons to communicate with others. Patients expressed if they thought they were good at communicating with others and ways they could improve their communication skills. The group identified important parts of communication and some experiences they have had in the past with communication. The group participated in the intervention "What is that?", where they had a chance to test out their communication skills and identify ways to improve their communication techniques.  Clinical Observations/Feedback:  Patient came to group and was focused on what his peers and staff had to say about communication. Individual was social with peers while participating in the intervention.  Jimmy Richards LRT/CTRS         Khloi Rawl 11/17/2017 12:33 PM

## 2017-11-17 NOTE — Plan of Care (Signed)
More visible in the milieu, pleasant and cooperative 

## 2017-11-17 NOTE — Progress Notes (Signed)
Recreation Therapy Notes  INPATIENT RECREATION THERAPY ASSESSMENT  Patient Details Name: Jimmy Richards MRN: [id number] DOB: 08-01-1995 Today's Date: 11/17/2017       Information Obtained From: Patient  Able to Participate in Assessment/Interview: Yes  Patient Presentation: Responsive  Reason for Admission (Per Patient): Active Symptoms, Med Non-Compliance, Suicide Attempt  Patient Stressors: Relationship  Coping Skills:   Other (Comment)(Smoke)  Leisure Interests (2+):  Individual - Reading  Frequency of Recreation/Participation: Monthly  Awareness of Community Resources:     [employer]:     Current Use:    If no, Barriers?:    Expressed Interest in [employer] Information:    [place] of Residence:  Film/video editor  Patient Main Form of Transportation: Walk  Patient Strengths:  Compassionate  Patient Identified Areas of Improvement:  Be more patient with people  Patient Goal for Hospitalization:  To get better  Current SI (including self-harm):  Yes(A little bit )  Current HI:  No  Current AVH: No  Staff Intervention Plan: Group Attendance, Collaborate with Interdisciplinary Treatment Team  Consent to Intern Participation: N/A  Jimmy Richards 11/17/2017, 4:31 PM
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
## 2017-11-17 NOTE — [facility] Suicide Risk Assessment (Signed)
[facility] INPATIENT:  Family/Significant Other Suicide Prevention Education  Suicide Prevention Education:  Education Completed; Reino Kent, [phone], Patients mother has been identified by the patient as the family member/significant other with whom the patient will be residing, and identified as the person(s) who will aid the patient in the event of a mental health crisis (suicidal ideations/suicide attempt).  With written consent from the patient, the family member/significant other has been provided the following suicide prevention education, prior to the and/or following the discharge of the patient.  The suicide prevention education provided includes the following:  Suicide risk factors  Suicide prevention and interventions  National Suicide Hotline telephone number  [facility] assessment telephone number  [facility] Emergency Assistance 911  [facility] and/or Residential Mobile Crisis Unit telephone number  Request made of family/significant other to:  Remove weapons (e.g., guns, rifles, knives), all items previously/currently identified as safety concern.    Remove drugs/medications (over-the-counter, prescriptions, illicit drugs), all items previously/currently identified as a safety concern.  The family member/significant other verbalizes understanding of the suicide prevention education information provided.  The family member/significant other agrees to remove the items of safety concern listed above.   CSW spoke with the patients mother regarding the patient and his progress. CSW answered many other the mothers questions. She reports that the patient can return there at discharge and that there are no guns/weapons in the home.   Johny Shears 11/17/2017, 1:58 PM

## 2017-11-17 NOTE — Plan of Care (Signed)
Cooperative and compliant with treatment 

## 2017-11-17 NOTE — Plan of Care (Signed)
Patient verbalizes understanding of the general information that's been provided to him and all questions/concerns have been addressed and answered at this time. Patient has been present out in the milieu throughout the afternoon interacting well with staff and other members on the unit. Patient denied SI/HI/AVH, however, he does endorse depression and anxiety rating depression a "6/10" and anxiety a "5/10" stating to this writer that this is normal for him and that "she wanted me to dig through the trash to get a plate" is why he's feeling this way. Patient has demonstrated self-control on the unit and has the ability to verbalize his anger/frustrations in an appropriate manner. Patient has been in compliance with his prescribed therapeutic regimen. Patient has the ability to identify the available resources that can assist him in meeting his health-care needs, but has not voiced anything to this Clinical research associate as of yet. Patient is working to maintain his clinical measurements within normal limits. Patient remains safe on the unit at this time.   Problem: Education: Goal: Knowledge of [facility] Education information/materials will improve Outcome: Progressing Goal: Emotional status will improve Outcome: Progressing Goal: Mental status will improve Outcome: Progressing Goal: Verbalization of understanding the information provided will improve Outcome: Progressing   Problem: Activity: Goal: Interest or engagement in activities will improve Outcome: Progressing Goal: Sleeping patterns will improve Outcome: Progressing   Problem: Coping: Goal: Ability to verbalize frustrations and anger appropriately will improve Outcome: Progressing Goal: Ability to demonstrate self-control will improve Outcome: Progressing   Problem: Health Behavior/Discharge Planning: Goal: Identification of resources available to assist in meeting health care needs will improve Outcome: Progressing Goal: Compliance  with treatment plan for underlying cause of condition will improve Outcome: Progressing   Problem: Physical Regulation: Goal: Ability to maintain clinical measurements within normal limits will improve Outcome: Progressing   Problem: Safety: Goal: Periods of time without injury will increase Outcome: Progressing   Problem: Self-Concept: Goal: Ability to disclose and discuss suicidal ideas will improve Outcome: Progressing Goal: Will verbalize positive feelings about self Outcome: Progressing   Problem: Education: Goal: Knowledge of General Education information will improve Description Including pain rating scale, medication(s)/side effects and non-pharmacologic comfort measures Outcome: Progressing

## 2017-11-17 NOTE — Progress Notes (Signed)
Patient present in the milieu. Currently in the dayroom with staff and peers. Calm and cooperative. Alert and oriented. Continues to express depression but denying thoughts of self harm. Reporting that mood is improving. Patient is more engaged in a conversation and thought process organized. No sign of distress.  Encouragements provided and safety precautions maintained.

## 2017-11-17 NOTE — [facility] Group Notes (Signed)
LCSW Group Therapy Note  11/17/2017 1:00 pm  Type of Therapy/Topic:  Group Therapy:  Emotion Regulation  Participation Level:  Minimal   Description of Group:    The purpose of this group is to assist patients in learning to regulate negative emotions and experience positive emotions. Patients will be guided to discuss ways in which they have been vulnerable to their negative emotions. These vulnerabilities will be juxtaposed with experiences of positive emotions or situations, and patients will be challenged to use positive emotions to combat negative ones. Special emphasis will be placed on coping with negative emotions in conflict situations, and patients will process healthy conflict resolution skills.  Therapeutic Goals: 1. Patient will identify two positive emotions or experiences to reflect on in order to balance out negative emotions 2. Patient will label two or more emotions that they find the most difficult to experience 3. Patient will demonstrate positive conflict resolution skills through discussion and/or role plays  Summary of Patient Progress: Jimmy Richards participated some in today's group discussion on emotion regulation.  [name] shared that "compassion" has been one emotion that he finds most difficult to experience.      Therapeutic Modalities:   Cognitive Behavioral Therapy Feelings Identification Dialectical Behavioral Therapy

## 2017-11-17 NOTE — Tx Team (Addendum)
Interdisciplinary Treatment and Diagnostic Plan Update  11/17/2017 Time of Session: 1030am Jimmy Richards MRN: [id number]  Principal Diagnosis: Major depressive disorder, recurrent severe without psychotic features (HCC)  Secondary Diagnoses: Principal Problem:   Major depressive disorder, recurrent severe without psychotic features (HCC) Active Problems:   PTSD (post-traumatic stress disorder)   Cannabis use disorder, severe, dependence (HCC)   Tobacco use disorder   Current Medications:  Current Facility-Administered Medications  Medication Dose Route Frequency Provider Last Rate Last Dose  . acetaminophen (TYLENOL) tablet 650 mg  650 mg Oral Q6H PRN Pucilowska, Jolanta B, MD      . alum & mag hydroxide-simeth (MAALOX/MYLANTA) 200-200-20 MG/5ML suspension 30 mL  30 mL Oral Q4H PRN Pucilowska, Jolanta B, MD      . busPIRone (BUSPAR) tablet 10 mg  10 mg Oral TID Pucilowska, Jolanta B, MD   10 mg at 11/17/17 1152  . feeding supplement (ENSURE ENLIVE) (ENSURE ENLIVE) liquid 237 mL  237 mL Oral TID BM Pucilowska, Jolanta B, MD   237 mL at 11/16/17 2010  . hydrOXYzine (ATARAX/VISTARIL) tablet 50 mg  50 mg Oral TID PRN Pucilowska, Jolanta B, MD      . magnesium hydroxide (MILK OF MAGNESIA) suspension 30 mL  30 mL Oral Daily PRN Pucilowska, Jolanta B, MD      . mirtazapine (REMERON) tablet 30 mg  30 mg Oral QHS Pucilowska, Jolanta B, MD   30 mg at 11/16/17 2150  . nicotine (NICODERM CQ - dosed in mg/24 hours) patch 21 mg  21 mg Transdermal Q0600 Pucilowska, Jolanta B, MD   21 mg at 11/17/17 0814  . pantoprazole (PROTONIX) EC tablet 40 mg  40 mg Oral Daily Pucilowska, Jolanta B, MD   40 mg at 11/17/17 0812  . QUEtiapine (SEROQUEL) tablet 200 mg  200 mg Oral QHS Pucilowska, Jolanta B, MD      . traZODone (DESYREL) tablet 100 mg  100 mg Oral QHS PRN Pucilowska, Jolanta B, MD   100 mg at 11/16/17 0015  . venlafaxine XR (EFFEXOR-XR) 24 hr capsule 150 mg  150 mg Oral Q breakfast Pucilowska,  Jolanta B, MD   150 mg at 11/17/17 1400   PTA Medications: Medications Prior to Admission  Medication Sig Dispense Refill Last Dose  . busPIRone (BUSPAR) 10 MG tablet Take 10 mg by mouth 3 (three) times daily.   Taking  . mirtazapine (REMERON) 30 MG tablet Take 30 mg by mouth.   Taking  . omeprazole (PRILOSEC) 40 MG capsule Take 1 capsule (40 mg total) by mouth daily for 14 days. 14 capsule 0   . QUEtiapine (SEROQUEL) 100 MG tablet Take 100 mg by mouth at bedtime.   Taking    Patient Stressors: Medication change or noncompliance Substance abuse  Patient Strengths: General fund of knowledge Supportive family/friends  Treatment Modalities: Medication Management, Group therapy, Case management,  1 to 1 session with clinician, Psychoeducation, Recreational therapy.   Physician Treatment Plan for Primary Diagnosis: Major depressive disorder, recurrent severe without psychotic features (HCC) Long Term Goal(s): Improvement in symptoms so as ready for discharge Improvement in symptoms so as ready for discharge   Short Term Goals: Ability to identify changes in lifestyle to reduce recurrence of condition will improve Ability to verbalize feelings will improve Ability to disclose and discuss suicidal ideas Ability to demonstrate self-control will improve Ability to identify and develop effective coping behaviors will improve Ability to maintain clinical measurements within normal limits will improve Compliance with prescribed medications will  improve Ability to identify triggers associated with substance abuse/mental health issues will improve Ability to identify changes in lifestyle to reduce recurrence of condition will improve Ability to demonstrate self-control will improve Ability to identify triggers associated with substance abuse/mental health issues will improve  Medication Management: Evaluate patient's response, side effects, and tolerance of medication regimen.  Therapeutic  Interventions: 1 to 1 sessions, Unit Group sessions and Medication administration.  Evaluation of Outcomes: Progressing  Physician Treatment Plan for Secondary Diagnosis: Principal Problem:   Major depressive disorder, recurrent severe without psychotic features (HCC) Active Problems:   PTSD (post-traumatic stress disorder)   Cannabis use disorder, severe, dependence (HCC)   Tobacco use disorder  Long Term Goal(s): Improvement in symptoms so as ready for discharge Improvement in symptoms so as ready for discharge   Short Term Goals: Ability to identify changes in lifestyle to reduce recurrence of condition will improve Ability to verbalize feelings will improve Ability to disclose and discuss suicidal ideas Ability to demonstrate self-control will improve Ability to identify and develop effective coping behaviors will improve Ability to maintain clinical measurements within normal limits will improve Compliance with prescribed medications will improve Ability to identify triggers associated with substance abuse/mental health issues will improve Ability to identify changes in lifestyle to reduce recurrence of condition will improve Ability to demonstrate self-control will improve Ability to identify triggers associated with substance abuse/mental health issues will improve     Medication Management: Evaluate patient's response, side effects, and tolerance of medication regimen.  Therapeutic Interventions: 1 to 1 sessions, Unit Group sessions and Medication administration.  Evaluation of Outcomes: Progressing   RN Treatment Plan for Primary Diagnosis: Major depressive disorder, recurrent severe without psychotic features (HCC) Long Term Goal(s): Knowledge of disease and therapeutic regimen to maintain health will improve  Short Term Goals: Ability to verbalize feelings will improve, Ability to identify and develop effective coping behaviors will improve and Compliance with  prescribed medications will improve  Medication Management: RN will administer medications as ordered by provider, will assess and evaluate patient's response and provide education to patient for prescribed medication. RN will report any adverse and/or side effects to prescribing provider.  Therapeutic Interventions: 1 on 1 counseling sessions, Psychoeducation, Medication administration, Evaluate responses to treatment, Monitor vital signs and CBGs as ordered, Perform/monitor CIWA, COWS, AIMS and Fall Risk screenings as ordered, Perform wound care treatments as ordered.  Evaluation of Outcomes: Progressing   LCSW Treatment Plan for Primary Diagnosis: Major depressive disorder, recurrent severe without psychotic features (HCC) Long Term Goal(s): Safe transition to appropriate next level of care at discharge, Engage patient in therapeutic group addressing interpersonal concerns.  Short Term Goals: Engage patient in aftercare planning with referrals and resources, Increase ability to appropriately verbalize feelings, Increase emotional regulation, Identify triggers associated with mental health/substance abuse issues and Increase skills for wellness and recovery  Therapeutic Interventions: Assess for all discharge needs, 1 to 1 time with Social worker, Explore available resources and support systems, Assess for adequacy in community support network, Educate family and significant other(s) on suicide prevention, Complete Psychosocial Assessment, Interpersonal group therapy.  Evaluation of Outcomes: Progressing   Progress in Treatment: Attending groups: Yes. Participating in groups: Yes. Taking medication as prescribed: Yes. Toleration medication: Yes. Family/Significant other contact made: No, will contact:  Reino Kent Patient understands diagnosis: Yes. Discussing patient identified problems/goals with staff: Yes. Medical problems stabilized or resolved: Yes. Denies suicidal/homicidal  ideation: Yes. Issues/concerns per patient self-inventory: No. Other: NA  New problem(s) identified:  No, Describe:  "I guess to get better"  New Short Term/Long Term Goal(s):I guess to get better  Patient Goals:  "I guess to get better"  Discharge Plan or Barriers: Pt will return home and follow up with outpatient treatment  Reason for Continuation of Hospitalization: Medication stabilization  Estimated Length of Stay: 3-5 days  Recreational Therapy: Patient Stressors: N/A Patient Goal: Patient will engage in interactions with peers and staff in pro-social manner at least 2x within 5 recreation therapy group sessions  Attendees: Patient:Jimmy Richards 11/17/2017 2:03 PM  Physician: Dr. Jennet Maduro 11/17/2017 2:03 PM  Nursing: Bruce Donath, RN 11/17/2017 2:03 PM  RN Care Manager: 11/17/2017 2:03 PM  Social Worker: Johny Shears, LCSW 11/17/2017 2:03 PM  Recreational Therapist: Danella Deis. Dreama Saa, LRT 11/17/2017 2:03 PM  Other: Lowella Dandy, LCSW 11/17/2017 2:03 PM  Other: Damian Leavell, Chaplain 11/17/2017 2:03 PM  Other:Sonya Montez Morita, LCSW 11/17/2017 2:03 PM    Scribe for Treatment Team: Suzan Slick, LCSW 11/17/2017 2:03 PM

## 2017-11-18 NOTE — [facility] Group Notes (Signed)
LCSW Group Therapy Note  11/18/2017 1:00 pm  Type of Therapy/Topic:  Group Therapy:  Balance in Life  Participation Level:  Minimal  Description of Group:    This group will address the concept of balance and how it feels and looks when one is unbalanced. Patients will be encouraged to process areas in their lives that are out of balance and identify reasons for remaining unbalanced. Facilitators will guide patients in utilizing problem-solving interventions to address and correct the stressor making their life unbalanced. Understanding and applying boundaries will be explored and addressed for obtaining and maintaining a balanced life. Patients will be encouraged to explore ways to assertively make their unbalanced needs known to significant others in their lives, using other group members and facilitator for support and feedback.  Therapeutic Goals: 1. Patient will identify two or more emotions or situations they have that consume much of in their lives. 2. Patient will identify signs/triggers that life has become out of balance:  3. Patient will identify two ways to set boundaries in order to achieve balance in their lives:  4. Patient will demonstrate ability to communicate their needs through discussion and/or role plays  Summary of Patient Progress: Cailean was able to participate some in today's group discussion on balance in life with the gentle encouragement of CSW.  Apolo shared that two emotions that have consumed his life are anger and confusion.  Yohance also shared that his mother is often a trigger that leads to his life becoming out of balance.     Therapeutic Modalities:   Cognitive Behavioral Therapy Solution-Focused Therapy Assertiveness Training  Alease Frame, [place] 11/18/2017 3:34 PM

## 2017-11-18 NOTE — Progress Notes (Signed)
Patient has been around and has been cooperative with care. Stayed in the dayroom for the majority of the evening  and participated in group activities: attended group and played games with peers. Upon assessment: patient was receptive and pleasant. Denied suicidal thoughts. Denied hallucinations. Appears slightly brighter and  is out of his room more often. Continues to express hopelessness and depression but admitting that it is getting better. Patient received a phone call and appeared to be having a healthy conversation with the caller. Patient consumed his ensure as well as other snacks. Received bedtime medications. Currently in the dayroom, watching a game with peers. No sign of distress noted. Support offered and safety monitored.

## 2017-11-18 NOTE — Plan of Care (Signed)
Patient stayed in bed till this afternoon.Rated his depression and anxiety 5/10.Stated that his depression is not much better.Denies SI,HI and AVH.Patient is more active in the milieu this afternoon.Interacting with staff & peers.Appetite poor,takes ensure.Compliant with medications.Support and encouragement given.

## 2017-11-18 NOTE — Plan of Care (Signed)
Mood and interactions improved. Cooperative and compliant with treatment. More visible in the milieu.

## 2017-11-18 NOTE — Progress Notes (Signed)
[facility] MD Progress Note  11/19/2017 2:58 PM Jimmy Richards  MRN:  [id number]  Subjective:   Jimmy Richards is doing slightly better today. Slept well, ate 3 meals, able to participate in some groups and talk to peers. Nicely engaging during the interview. Tolerates medications well. Inquiring about ECT but there is clear improvement.   Principal Problem: Major depressive disorder, recurrent severe without psychotic features (HCC) Diagnosis:   Patient Active Problem List   Diagnosis Date Noted  . Major depressive disorder, recurrent severe without psychotic features (HCC) [F33.2] 11/16/2017    Priority: High  . Overdose [T50.901A] 11/15/2017  . Tobacco use disorder [F17.200] 11/15/2017  . Generalized anxiety disorder [F41.1] 09/04/2015  . PTSD (post-traumatic stress disorder) [F43.10] 09/04/2015  . Cannabis use disorder, severe, dependence (HCC) [F12.20] 09/04/2015  . 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 Anxiety   . Chronic kidney disease   . Depression    History reviewed. No pertinent surgical history. Family History:  Family History  Problem Relation Age of Onset  . Anxiety disorder Mother   . Depression Mother   . Anxiety disorder Father   . Depression Father   . Anxiety disorder Sister   . Depression Sister   . Anxiety disorder Brother   . Depression Brother   . Anxiety disorder Sister   . Depression Sister   . Anxiety disorder Sister   . Depression Sister   . Drug abuse Sister   . Anxiety disorder Sister   . Depression Sister   . Anxiety disorder Sister   . Depression Sister   . Anxiety disorder Sister   . Depression Sister   . Anxiety disorder Sister   . Depression Sister   . Anxiety disorder Sister   . Depression  Sister    Family Psychiatric  History: depression Social History:  Social History   Substance and Sexual Activity  Alcohol Use Yes  . Alcohol/week: 0.0 - 4.0 standard drinks   Comment: occassionally     Social History   Substance and Sexual Activity  Drug Use Yes  . Types: Marijuana   Comment: Daily    Social History   Socioeconomic History  . Marital status: Single    Spouse name: Not on file  . Number of children: Not on file  . Years of education: Not on file  . Highest education level: Not on file  Occupational History  . Not on file  Social Needs  . Financial resource strain: Somewhat hard  . Food insecurity:    Worry: Never true    Inability: Never true  . Transportation needs:    Medical: No    Non-medical: No  Tobacco Use  . Smoking status: Current Every Day Smoker    Packs/day: 1.00    Years: 10.00    Pack years: 10.00    Types: Cigarettes  . Smokeless tobacco: Never Used  . Tobacco comment: refused  Substance and Sexual Activity  . Alcohol use: Yes    Alcohol/week: 0.0 - 4.0 standard drinks    Comment: occassionally  . Drug use: Yes    Types: Marijuana    Comment: Daily  . Sexual activity: Yes    Birth control/protection: None  Lifestyle  . Physical activity:    Days per week: 0 days  Minutes per session: 0 min  . Stress: Rather much  Relationships  . Social connections:    Talks on phone: More than three times a week    Gets together: Never    Attends religious service: Never    Active member of club or organization: No    Attends meetings of clubs or organizations: Never    Relationship status: Never married  Other Topics Concern  . Not on file  Social History Narrative  . Not on file   Additional Social History:    Pain Medications: Denies Prescriptions: Denies Over the Counter: Denies History of alcohol / drug use?: Yes Longest period of sobriety (when/how long): unknown Negative Consequences of Use: Financial Withdrawal  Symptoms: (none) Name of Substance 1: Marijuana 1 - Age of First Use: unable to state 1 - Amount (size/oz): varies 1 - Frequency: daily 1 - Duration: unknown 1 - Last Use / Amount: 11/15/2017                  Sleep: Poor  Appetite:  Poor  Current Medications: Current Facility-Administered Medications  Medication Dose Route Frequency Provider Last Rate Last Dose  . acetaminophen (TYLENOL) tablet 650 mg  650 mg Oral Q6H PRN Selita Staiger B, MD      . alum & mag hydroxide-simeth (MAALOX/MYLANTA) 200-200-20 MG/5ML suspension 30 mL  30 mL Oral Q4H PRN Karolyne Timmons B, MD      . busPIRone (BUSPAR) tablet 10 mg  10 mg Oral TID Emric Kowalewski B, MD   10 mg at 11/19/17 1206  . feeding supplement (ENSURE ENLIVE) (ENSURE ENLIVE) liquid 237 mL  237 mL Oral TID BM Mirca Yale B, MD   237 mL at 11/19/17 1033  . hydrOXYzine (ATARAX/VISTARIL) tablet 50 mg  50 mg Oral TID PRN Kory Rains B, MD      . magnesium hydroxide (MILK OF MAGNESIA) suspension 30 mL  30 mL Oral Daily PRN Caedmon Louque B, MD      . mirtazapine (REMERON) tablet 30 mg  30 mg Oral QHS Casimir Barcellos B, MD   30 mg at 11/18/17 2208  . nicotine (NICODERM CQ - dosed in mg/24 hours) patch 21 mg  21 mg Transdermal Q0600 Adian Jablonowski B, MD   21 mg at 11/19/17 0859  . pantoprazole (PROTONIX) EC tablet 40 mg  40 mg Oral Daily Almendra Loria B, MD   40 mg at 11/19/17 0859  . QUEtiapine (SEROQUEL) tablet 200 mg  200 mg Oral QHS Cutter Passey B, MD   200 mg at 11/18/17 2208  . traZODone (DESYREL) tablet 100 mg  100 mg Oral QHS PRN Nashawn Hillock B, MD   100 mg at 11/16/17 0015  . venlafaxine XR (EFFEXOR-XR) 24 hr capsule 150 mg  150 mg Oral Q breakfast Shilpa Bushee B, MD   150 mg at 11/19/17 0859    Lab Results: No results found for this or any previous visit (from the past 48 hour(s)).  Blood Alcohol level:  Lab Results  Component Value Date   ETH <10  11/15/2017   ETH 76 (H) 09/13/2017    Metabolic Disorder Labs: Lab Results  Component Value Date   HGBA1C 4.9 11/15/2017   MPG 93.93 11/15/2017   No results found for: PROLACTIN Lab Results  Component Value Date   CHOL 147 11/15/2017   TRIG 79 11/15/2017   HDL 36 (L) 11/15/2017   CHOLHDL 4.1 11/15/2017   VLDL 16 11/15/2017   LDLCALC 95 11/15/2017  Physical Findings: AIMS:  , ,  ,  ,    CIWA:    COWS:     Musculoskeletal: Strength & Muscle Tone: within normal limits Gait & Station: normal Patient leans: N/A  Psychiatric Specialty Exam: Physical Exam  Nursing note and vitals reviewed. Psychiatric: His speech is normal. His affect is blunt. He is slowed and withdrawn. Cognition and memory are normal. He expresses impulsivity. He exhibits a depressed mood. He expresses suicidal ideation.    Review of Systems  Neurological: Negative.   Psychiatric/Behavioral: Positive for depression and suicidal ideas.  All other systems reviewed and are negative.   Blood pressure 112/72, pulse (!) 109, temperature 98 F (36.7 C), temperature source Oral, resp. rate 18, height 5\' 10"  (1.778 m), weight 57.6 kg, SpO2 99 %.Body mass index is 18.22 kg/m.  General Appearance: Casual  Eye Contact:  Good  Speech:  Clear and Coherent  Volume:  Normal  Mood:  Depressed  Affect:  Congruent  Thought Process:  Goal Directed and Descriptions of Associations: Intact  Orientation:  Full (Time, Place, and Person)  Thought Content:  WDL  Suicidal Thoughts:  No  Homicidal Thoughts:  No  Memory:  Immediate;   Fair Recent;   Fair Remote;   Fair  Judgement:  Impaired  Insight:  Lacking  Psychomotor Activity:  Normal  Concentration:  Concentration: Fair and Attention Span: Fair  Recall:  [employer] of Knowledge:  Fair  Language:  Fair  Akathisia:  No  Handed:  Right  AIMS (if indicated):     Assets:  Communication Skills Desire for Improvement Financial Resources/Insurance Physical  Health Resilience Social Support  ADL's:  Intact  Cognition:  WNL  Sleep:  Number of Hours: 6     Treatment Plan Summary: Daily contact with patient to assess and evaluate symptoms and progress in treatment and Medication management   Jimmy Richards is a 22 year old male with a history of severe depression admitted after overdose on Motrin in the context of relationship problems and treatment noncomplinace.  #Suicidal ideation, still suicidal -patient able to contract for safety in the hospital  #Mood -continue Remeron 30 mg nightly -Seroquel200 mg nightly  -BuSpar 10 mg TID -Effexor 150 mg daily  #Insomnia, slept 7 hours -continue Restoril 15 mg nightly  #Weight loss -Ensure TID  #Cannabis abuse -minimizes problems and declines treatment  #Overdose -denies any GI symptoms -continue Protonix 40 mg daily  #Smoking cessation -nicotine patch is available  #Labs -lipid panel, TSH, A1C are normal -EKG reviewed, NSR with QTc 416  #Disposition -discharge to home with the mother -follow up with [facility]  Jimmy Linea, MD 11/19/2017, 2:58 PM

## 2017-11-18 NOTE — Progress Notes (Signed)
Recreation Therapy Notes  Date: 11/18/2017  Time: 9:30 pm   Location: Craft Room   Behavioral response: N/A   Intervention Topic: Emotions  Discussion/Intervention: Patient did not attend group.   Clinical Observations/Feedback:  Patient did not attend group.   [employer] LRT/CTRS       Demitrios Molyneux 11/18/2017 11:05 AM

## 2017-11-19 NOTE — [facility] Group Notes (Signed)
Feelings Around Relapse 11/19/2017 1PM  Type of Therapy and Topic:  Group Therapy:  Feelings around Relapse and Recovery  Participation Level:  Active   Description of Group:    Patients in this group will discuss emotions they experience before and after a relapse. They will process how experiencing these feelings, or avoidance of experiencing them, relates to having a relapse. Facilitator will guide patients to explore emotions they have related to recovery. Patients will be encouraged to process which emotions are more powerful. They will be guided to discuss the emotional reaction significant others in their lives may have to patients' relapse or recovery. Patients will be assisted in exploring ways to respond to the emotions of others without this contributing to a relapse.  Therapeutic Goals: 1. Patient will identify two or more emotions that lead to a relapse for them 2. Patient will identify two emotions that result when they relapse 3. Patient will identify two emotions related to recovery 4. Patient will demonstrate ability to communicate their needs through discussion and/or role plays   Summary of Patient Progress:  Actively and appropriately engaged in the group. Patient was able to provide support and validation to other group members.Patient practiced active listening when interacting with the facilitator and other group members. Pt states "healthy behaviors can prevent relapse" Pt reports "I need to have more tolerance."  Patient is still in the process of obtaining treatment goals.     Therapeutic Modalities:   Cognitive Behavioral Therapy Solution-Focused Therapy Assertiveness Training Relapse Prevention Therapy   Suzan Slick, LCSW 11/19/2017 3:21 PM

## 2017-11-19 NOTE — Plan of Care (Signed)
Patient is visible in the milieu.Appropriate with staff & peers.Patient verbalized that the visitation with his mother did not go well yesterday and he will try not to talk about the past things.Compliant with medications.Appetite and energy level good.Attended groups.Support and encouragement given.

## 2017-11-19 NOTE — Progress Notes (Signed)
Recreation Therapy Notes  Date: 11/19/2017  Time: 9:30 am  Location: Craft Room  Behavioral response: Appropriate   Intervention Topic: Team work  Discussion/Intervention:  Group content on today was focused on teamwork. The group identified what teamwork is. Individuals described who is a part of their team. Patients expressed why they thought teamwork is important. The group stated reasons why they thought it was easier to work with a Comptroller team. Individuals discussed some positives and negatives of working with a team. Patients gave examples of past experiences they had while working with a team. The group participated in the intervention "What is That", where patients were given a chance to point out qualities, they look for in a team mate and were able to work in teams with each other. Clinical Observations/Feedback:  Patient came to group and was focused on what peers and staff had to say about team work. Individual was social with peers and staff while participating in the intervention.  Remell Giaimo LRT/CTRS         Melvena Vink 11/19/2017 11:24 AM

## 2017-11-20 NOTE — Plan of Care (Signed)
Resting in bed calm and maintaining safety , compliant with medications , contract for safety of self and others, voice no concerns , states depression /anxiety at 5/10, patient encouraged to apply positive coping skills, interact and participate in group activities, acknowledge information provided, denies SI/HI/AVH, compliant with medications , 15 minute safety rounding in progress.   Problem: Education: Goal: Knowledge of [facility] Education information/materials will improve Outcome: Progressing Goal: Emotional status will improve Outcome: Progressing Goal: Mental status will improve Outcome: Progressing Goal: Verbalization of understanding the information provided will improve Outcome: Progressing   Problem: Activity: Goal: Interest or engagement in activities will improve Outcome: Progressing Goal: Sleeping patterns will improve Outcome: Progressing   Problem: Coping: Goal: Ability to verbalize frustrations and anger appropriately will improve Outcome: Progressing Goal: Ability to demonstrate self-control will improve Outcome: Progressing   Problem: Health Behavior/Discharge Planning: Goal: Identification of resources available to assist in meeting health care needs will improve Outcome: Progressing Goal: Compliance with treatment plan for underlying cause of condition will improve Outcome: Progressing   Problem: Physical Regulation: Goal: Ability to maintain clinical measurements within normal limits will improve Outcome: Progressing   Problem: Safety: Goal: Periods of time without injury will increase Outcome: Progressing   Problem: Self-Concept: Goal: Ability to disclose and discuss suicidal ideas will improve Outcome: Progressing Goal: Will verbalize positive feelings about self Outcome: Progressing   Problem: Education: Goal: Knowledge of General Education information will improve Description Including pain rating scale, medication(s)/side effects  and non-pharmacologic comfort measures Outcome: Progressing

## 2017-11-20 NOTE — Plan of Care (Signed)
Patient knowledgeable of information received , compliant  with medication received. Emotional and mental status improved . Voice of no safety concerns or sleep issues.    . Denies  Suicidal ideation. Working on Engineer, site   Problem: Education: Goal: Knowledge of [facility] Education information/materials will improve Outcome: Progressing Goal: Emotional status will improve Outcome: Progressing Goal: Mental status will improve Outcome: Progressing Goal: Verbalization of understanding the information provided will improve Outcome: Progressing   Problem: Activity: Goal: Sleeping patterns will improve Outcome: Progressing   Problem: Coping: Goal: Ability to verbalize frustrations and anger appropriately will improve Outcome: Progressing Goal: Ability to demonstrate self-control will improve Outcome: Progressing   Problem: Safety: Goal: Periods of time without injury will increase Outcome: Progressing   Problem: Self-Concept: Goal: Ability to disclose and discuss suicidal ideas will improve Outcome: Progressing Goal: Will verbalize positive feelings about self Outcome: Progressing
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
## 2017-11-20 NOTE — Plan of Care (Signed)
  Problem: Education: Goal: Mental status will improve Outcome: Progressing  Alert and oriented x 4, follows simple command. Will continue to monitor.

## 2017-11-20 NOTE — [facility] Group Notes (Signed)
[facility] Group Notes:  (Nursing/MHT/Case Management/Adjunct)  Date:  11/20/2017  Time:  9:45 PM  Type of Therapy:  Group Therapy  Participation Level:  Active  Participation Quality:  Appropriate  Affect:  Appropriate  Cognitive:  Appropriate  Insight:  Good  Engagement in Group:  Engaged and Talking doing group to other people  Modes of Intervention:  Socialization  Summary of Progress/Problems:  Jimmy Richards 11/20/2017, 9:45 PM

## 2017-11-20 NOTE — Progress Notes (Signed)
D: Patient stated slept good last night .Stated appetite  Fair  and energy level  normal. Stated concentration is good . Stated on Depression scale 3 , hopeless 0 and anxiety 0 .( low 0-10 high) Denies suicidal  homicidal ideations .  No auditory hallucinations  No pain concerns . Appropriate ADL'S. Interacting with peers and staff.  Patient knowledgeable of information received , compliant  with medication received. Emotional and mental status improved . Voice of no safety concerns or sleep issues.    . Denies  Suicidal ideation. 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 Instruction  Given on  Medication , verbalize understanding.  R: Voice no other concerns. Staff continue to monitor

## 2017-11-20 NOTE — Progress Notes (Signed)
Patient alert and oriented x 4, she denies SI/HI/AVH, he was noted pacing the unit, hyperactive and euphoric, his thoughts are disorganized and incoherent, he was often redirected for safety, no distress noted, he was given support and encouraged to attend evening wrap up group.. Patient attends wrap up group and he interacted appropriately with peers and staff. 15 minutes safety checks maintained, will continue to monitor

## 2017-11-21 MED ORDER — MIRTAZAPINE 15 MG PO TABS
30.0000 mg | ORAL_TABLET | Freq: Every day | ORAL | Status: DC
  Filled 2017-11-21: qty 14
  Filled 2017-11-21: qty 2

## 2017-11-21 MED ORDER — BUSPIRONE HCL 10 MG PO TABS
10.0000 mg | ORAL_TABLET | Freq: Three times a day (TID) | ORAL | Status: DC
  Filled 2017-11-21: qty 21
  Filled 2017-11-21: qty 1

## 2017-11-21 MED ORDER — VENLAFAXINE HCL ER 75 MG PO CP24
150.0000 mg | ORAL_CAPSULE | Freq: Every day | ORAL | Status: DC
  Filled 2017-11-21: qty 2
  Filled 2017-11-21: qty 14

## 2017-11-21 MED ORDER — TRAZODONE HCL 100 MG PO TABS
100.0000 mg | ORAL_TABLET | Freq: Every day | ORAL | Status: DC
  Filled 2017-11-21: qty 1
  Filled 2017-11-21: qty 7

## 2017-11-21 MED ORDER — QUETIAPINE FUMARATE 200 MG PO TABS
200.0000 mg | ORAL_TABLET | Freq: Every day | ORAL | Status: DC
  Filled 2017-11-21: qty 1
  Filled 2017-11-21: qty 7

## 2017-11-21 MED ORDER — MIRTAZAPINE 30 MG PO TABS: 30.0000 mg | ORAL_TABLET | Freq: Every day | ORAL | 1 refills | Status: DC

## 2017-11-21 MED ORDER — VENLAFAXINE HCL ER 150 MG PO CP24: 150.0000 mg | ORAL_CAPSULE | Freq: Every day | ORAL | 1 refills | Status: DC

## 2017-11-21 MED ORDER — BUSPIRONE HCL 10 MG PO TABS: 10.0000 mg | ORAL_TABLET | Freq: Three times a day (TID) | ORAL | 1 refills | Status: DC

## 2017-11-21 MED ORDER — QUETIAPINE FUMARATE 200 MG PO TABS: 200.0000 mg | ORAL_TABLET | Freq: Every day | ORAL | 1 refills | Status: DC

## 2017-11-21 MED ORDER — TRAZODONE HCL 100 MG PO TABS: 100.0000 mg | ORAL_TABLET | Freq: Every evening | ORAL | 1 refills | Status: DC | PRN

## 2017-11-21 MED ORDER — BUSPIRONE HCL 10 MG PO TABS: 10.0000 mg | ORAL_TABLET | Freq: Three times a day (TID) | ORAL | Status: DC

## 2017-11-21 NOTE — Progress Notes (Signed)
Patient was provided with discharge summary, Transition packet, and Suicide Risk Assessment. Verbalized discharge summary, transition packet and suicide risk assessment, patient made aware of any change in medications, and all upcoming appointments.   Patient belongings/money returned. Personal Medications, Supplied Medications, Rx provided to patient.  Patient denied any SI, denies plans for self harm or the harm of others. Patient is calm, with appropriate affect and eye contact. Patient reports no complaints at this time.   Patient will be discharged to self.

## 2017-11-21 NOTE — Discharge Summary (Signed)
Physician Discharge Summary Note  Patient:  Jimmy Richards is an 22 y.o., male MRN:  [id number] DOB:  April 07, 1995 Patient phone:  [phone] (home)  Patient address:   [address] [place] [address],  Total Time spent with patient: 20 minutes plus 15 min on care coordination and c=documentation  Date of Admission:  11/15/2017 Date of Discharge: 11/21/2017  Reason for Admission:  Suicidal ideation.  History of Present Illness:   Identifying data. Mr. Vossler is a 22 year old male with a history of depression.   Chief complaint. "I'm tired."  History of present illness. Information was obtained from the patient and the chart. The patient was brought to the ER after intentional overdose on Ibuprofen 3000 mg in the context of relationship problems after his girlfriend broke up with him. He has a long history of depression that has gotten worse since he stopped taking his medications several months ago. He reports poor sleep, decreased appetite with weight loss, anhedonia, feeling of hopelessness worthlessness and guilt, poor energy and concentration, social isolation, crying spells that culminated in overdose that was impulsive. He denies psychotic symptoms or high anxiety. He has been smoking cannabis but no alcohol or other drugs.   The patient is not engaging in the interview. He is in a fetal position and dose not make any effort to maintain eye contact.  Past psychiatric history. Depression as long as he can remember. Hospitalized twice before as adult for overdose and suicidal ideation. Responded well to Remeron, Seroquel and BuSpar.  Family psychiatric history. Brother with depression.  Social history. Dropped out of high school in the ninth grade "did not like school". Lives with his mother. Just quit a job at the [employer] due to [employer].   Principal Problem: Major depressive disorder, recurrent severe without psychotic features [facility]) Discharge Diagnoses: Patient  Active Problem List   Diagnosis Date Noted  . Major depressive disorder, recurrent severe without psychotic features (HCC) [F33.2] 11/16/2017    Priority: High  . Overdose [T50.901A] 11/15/2017  . Tobacco use disorder [F17.200] 11/15/2017  . Generalized anxiety disorder [F41.1] 09/04/2015  . PTSD (post-traumatic stress disorder) [F43.10] 09/04/2015  . Cannabis use disorder, severe, dependence (HCC) [F12.20] 09/04/2015  . 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 Anxiety   . Chronic kidney disease   . Depression    History reviewed. No pertinent surgical history. Family History:  Family History  Problem Relation Age of Onset  . Anxiety disorder Mother   . Depression Mother   . Anxiety disorder Father   . Depression Father   . Anxiety disorder Sister   . Depression Sister   . Anxiety disorder Brother   . Depression Brother   . Anxiety disorder Sister   . Depression Sister   . Anxiety disorder Sister   . Depression Sister   . Drug abuse Sister   . Anxiety disorder Sister   . Depression Sister   . Anxiety disorder Sister   . Depression Sister   . Anxiety disorder Sister   . Depression Sister   . Anxiety disorder Sister   . Depression Sister   . Anxiety disorder Sister   . Depression Sister    Social History:  Social History   Substance and Sexual Activity  Alcohol Use Yes  . Alcohol/week: 0.0 - 4.0 standard drinks   Comment: occassionally  Social History   Substance and Sexual Activity  Drug Use Yes  . Types: Marijuana   Comment: Daily    Social History   Socioeconomic History  . Marital status: Single    Spouse name: Not on file  . Number of children: Not on file  . Years of education: Not on file  . Highest education level: Not on file  Occupational History  . Not on file  Social Needs  .  Financial resource strain: Somewhat hard  . Food insecurity:    Worry: Never true    Inability: Never true  . Transportation needs:    Medical: No    Non-medical: No  Tobacco Use  . Smoking status: Current Every Day Smoker    Packs/day: 1.00    Years: 10.00    Pack years: 10.00    Types: Cigarettes  . Smokeless tobacco: Never Used  . Tobacco comment: refused  Substance and Sexual Activity  . Alcohol use: Yes    Alcohol/week: 0.0 - 4.0 standard drinks    Comment: occassionally  . Drug use: Yes    Types: Marijuana    Comment: Daily  . Sexual activity: Yes    Birth control/protection: None  Lifestyle  . Physical activity:    Days per week: 0 days    Minutes per session: 0 min  . Stress: Rather much  Relationships  . Social connections:    Talks on phone: More than three times a week    Gets together: Never    Attends religious service: Never    Active member of club or organization: No    Attends meetings of clubs or organizations: Never    Relationship status: Never married  Other Topics Concern  . Not on file  Social History Narrative  . Not on file    Hospital Course:    Mr. Carpenter is a 22 year old male with a history of severe depression admitted after overdose on Motrin in the context of relationship problems and treatment noncomplinace. He accepted medications and tolerated them well. At the time of discharge, the patient is no longer suicidal or homicidal. He is able to contract for safety. He is forward thinking and optimistic about the future.  #Mood, improved -continueRemeron 30 mg nightly -Seroquel200 mg nightly  -BuSpar 10 mg TID -Effexor 150 mg daily  #Insomnia, slept 8 hours -Trazodone 100 mg PRN  #Cannabis abuse -minimizes problems and declines treatment  #Overdose -denies any GI symptoms -received Protonix 40 mg daily  #Smoking cessation -nicotine patch is available  #Labs -lipid panel, TSH, [facility] normal -EKGreviewed, NSR  with QTc 416  #Disposition -discharge to home with the mother -follow up with [facility] for medication management and individual therapy  Physical Findings: AIMS:  , ,  ,  ,    CIWA:    COWS:     Musculoskeletal: Strength & Muscle Tone: within normal limits Gait & Station: normal Patient leans: N/A  Psychiatric Specialty Exam: Physical Exam  Nursing note and vitals reviewed. Psychiatric: He has a normal mood and affect. His speech is normal and behavior is normal. Thought content normal. Cognition and memory are normal. He expresses impulsivity.    Review of Systems  Neurological: Negative.   Psychiatric/Behavioral: Negative.   All other systems reviewed and are negative.   Blood pressure 116/82, pulse 95, temperature 98.7 F (37.1 C), temperature source Oral, resp. rate 16, height 5\' 10"  (1.778 m), weight 57.6 kg, SpO2 100 %.Body mass index is 18.22 kg/m.  General Appearance: Casual  Eye Contact:  Good  Speech:  Clear and Coherent  Volume:  Normal  Mood:  Euthymic  Affect:  Appropriate  Thought Process:  Goal Directed and Descriptions of Associations: Intact  Orientation:  Full (Time, Place, and Person)  Thought Content:  WDL  Suicidal Thoughts:  No  Homicidal Thoughts:  No  Memory:  Immediate;   Fair Recent;   Fair Remote;   Fair  Judgement:  Impaired  Insight:  Shallow  Psychomotor Activity:  Normal  Concentration:  Concentration: Fair and Attention Span: Fair  Recall:  [employer] of Knowledge:  Fair  Language:  Fair  Akathisia:  No  Handed:  Right  AIMS (if indicated):     Assets:  Communication Skills Desire for Improvement Financial Resources/Insurance Housing Physical Health Resilience Social Support  ADL's:  Intact  Cognition:  WNL  Sleep:  Number of Hours: 7.45     Have you used any form of tobacco in the last 30 days? (Cigarettes, Smokeless Tobacco, Cigars, and/or Pipes): Yes  Has this patient used any form of tobacco in the last 30 days?  (Cigarettes, Smokeless Tobacco, Cigars, and/or Pipes) Yes, Yes, A prescription for an FDA-approved tobacco cessation medication was offered at discharge and the patient refused  Blood Alcohol level:  Lab Results  Component Value Date   ETH <10 11/15/2017   ETH 76 (H) 09/13/2017    Metabolic Disorder Labs:  Lab Results  Component Value Date   HGBA1C 4.9 11/15/2017   MPG 93.93 11/15/2017   No results found for: PROLACTIN Lab Results  Component Value Date   CHOL 147 11/15/2017   TRIG 79 11/15/2017   HDL 36 (L) 11/15/2017   CHOLHDL 4.1 11/15/2017   VLDL 16 11/15/2017   LDLCALC 95 11/15/2017    See Psychiatric Specialty Exam and Suicide Risk Assessment completed by Attending Physician prior to discharge.  Discharge destination:  Home  Is patient on multiple antipsychotic therapies at discharge:  No   Has Patient had three or more failed trials of antipsychotic monotherapy by history:  No  Recommended Plan for Multiple Antipsychotic Therapies: NA  Discharge Instructions    Diet - low sodium heart healthy   Complete by:  As directed    Increase activity slowly   Complete by:  As directed      Allergies as of 11/21/2017   No Known Allergies     Medication List    STOP taking these medications   omeprazole 40 MG capsule Commonly known as:  PRILOSEC     TAKE these medications     Indication  busPIRone 10 MG tablet Commonly known as:  BUSPAR Take 1 tablet (10 mg total) by mouth 3 (three) times daily.  Indication:  Anxiety Disorder, Major Depressive Disorder   mirtazapine 30 MG tablet Commonly known as:  REMERON Take 1 tablet (30 mg total) by mouth at bedtime. What changed:  when to take this  Indication:  Major Depressive Disorder, Panic Disorder   QUEtiapine 200 MG tablet Commonly known as:  SEROQUEL Take 1 tablet (200 mg total) by mouth at bedtime. What changed:    medication strength  how much to take  Indication:  Major Depressive Disorder    traZODone 100 MG tablet Commonly known as:  DESYREL Take 1 tablet (100 mg total) by mouth at bedtime as needed for sleep.  Indication:  Trouble Sleeping   venlafaxine XR 150 MG 24 hr capsule Commonly known as:  EFFEXOR-XR Take  1 capsule (150 mg total) by mouth daily with breakfast. Start taking on:  11/22/2017  Indication:  Major Depressive Disorder      Follow-up Information    [facility], [facility]. Go on 11/24/2017.   Why:  Plese follow up on Wednesday November 24, 2017 at 8:30am. Please bring your ID, discharge summary and all medications. Thank you. Contact information: [address] [place] [address] [phone]           Follow-up recommendations:  Activity:  as tolerated Diet:  regular Other:  keep follow up appointments  Comments:    Signed: Kristine Linea, MD 11/21/2017, 2:24 PM

## 2017-11-21 NOTE — BHH Suicide Risk Assessment (Signed)
[facility] Discharge Suicide Risk Assessment   Principal Problem: Major depressive disorder, recurrent severe without psychotic features [employer]) Discharge Diagnoses:  Patient Active Problem List   Diagnosis Date Noted  . Major depressive disorder, recurrent severe without psychotic features (HCC) [F33.2] 11/16/2017    Priority: High  . Overdose [T50.901A] 11/15/2017  . Tobacco use disorder [F17.200] 11/15/2017  . Generalized anxiety disorder [F41.1] 09/04/2015  . PTSD (post-traumatic stress disorder) [F43.10] 09/04/2015  . Cannabis use disorder, severe, dependence (HCC) [F12.20] 09/04/2015  . 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 rate 16, height 5\' 10"  (1.778 m), weight 57.6 kg, SpO2 100 %.Body mass index is 18.22 kg/m.  General Appearance: Casual  Eye Contact::  Good  Speech:  Clear and Coherent409  Volume:  Normal  Mood:  Euthymic  Affect:  Appropriate  Thought Process:  Goal Directed and Descriptions of Associations: Intact  Orientation:  Full (Time, Place, and Person)  Thought Content:  WDL  Suicidal Thoughts:  No  Homicidal Thoughts:  No  Memory:  Immediate;   Fair Recent;   Fair Remote;   Fair  Judgement:  Impaired  Insight:  Shallow  Psychomotor Activity:  Normal  Concentration:  Fair  Recall:  [employer] of Knowledge:Fair  Language: Fair  Akathisia:  No  Handed:  Right  AIMS (if indicated):     Assets:  Communication Skills Desire for Improvement Physical Health Resilience Social Support  Sleep:  Number of Hours: 7.45  Cognition: WNL  ADL's:  Intact   Mental Status Per Nursing Assessment::   On  Admission:  Suicidal ideation indicated by patient  Demographic Factors:  Male, Adolescent or young adult, Caucasian and Unemployed  Loss Factors: Loss of significant relationship  Historical Factors: Prior suicide attempts, Family history of mental illness or substance abuse and Impulsivity  Risk Reduction Factors:   Sense of responsibility to family, Living with another person, especially a relative and Positive social support  Continued Clinical Symptoms:  Severe Anxiety and/or Agitation Depression:   Impulsivity  Cognitive Features That Contribute To Risk:  None    Suicide Risk:  Minimal: No identifiable suicidal ideation.  Patients presenting with no risk factors but with morbid ruminations; may be classified as minimal risk based on the severity of the depressive symptoms  Follow-up Information    Pc, [employer]. Go on 11/24/2017.   Why:  Plese follow up on Wednesday November 24, 2017 at 8:30am. Please bring your ID, discharge summary and all medications. Thank you. Contact information: [address] [place] [address] [phone]           Plan Of Care/Follow-up recommendations:  Activity:  as tolerated Diet:  low sodium heart healthy Other:  keep follow up appointments    Kristine Linea, MD 11/21/2017, 8:39 AM

## 2017-11-21 NOTE — BHH Group Notes (Signed)
LCSW Group Therapy Note 11/21/2017 1:15pm  Type of Therapy and Topic: Group Therapy: Feelings Around Returning Home & Establishing a Supportive Framework and Supporting Oneself When Supports Not Available  Participation Level: Active  Description of Group:  Patients first processed thoughts and feelings about upcoming discharge. These included fears of upcoming changes, lack of change, new living environments, judgements and expectations from others and overall stigma of mental health issues. The group then discussed the definition of a supportive framework, what that looks and feels like, and how do to discern it from an unhealthy non-supportive network. The group identified different types of supports as well as what to do when your family/friends are less than helpful or unavailable  Therapeutic Goals  1. Patient will identify one healthy supportive network that they can use at discharge. 2. Patient will identify one factor of a supportive framework and how to tell it from an unhealthy network. 3. Patient able to identify one coping skill to use when they do not have positive supports from others. 4. Patient will demonstrate ability to communicate their needs through discussion and/or role plays.  Summary of Patient Progress:  The patient reported he feels "good." Pt engaged during group session. As patients processed their anxiety about discharge and described healthy supports patient shared he is ready to be discharged because he is not suicidal anymore. Patient listed his girlfriend and brother as his main support system.  Patients identified at least one self-care tool they were willing to use after discharge; talking to a therapist.   Therapeutic Modalities Cognitive Behavioral Therapy Motivational Interviewing   Kemaria Dedic  CUEBAS-COLON, LCSW 11/21/2017 1:09 PM

## 2017-11-21 NOTE — Progress Notes (Signed)
  [facility] Adult Case Management Discharge Plan :  Will you be returning to the same living situation after discharge:  Yes,  pt will go back to his mother At discharge, do you have transportation home?: Yes,  mom will pick him up Do you have the ability to pay for your medications: No.  Release of information consent forms completed and in the chart;  Patient's signature needed at discharge.  Patient to Follow up at: Follow-up Information    Pc, [employer]. Go on 11/24/2017.   Why:  Plese follow up on Wednesday November 24, 2017 at 8:30am. Please bring your ID, discharge summary and all medications. Thank you. Contact information: [address] [place] [address] [phone]           Next level of care provider has access to [facility] Link:yes  Safety Planning and Suicide Prevention discussed: Yes,  CSW discussed with patient  Have you used any form of tobacco in the last 30 days? (Cigarettes, Smokeless Tobacco, Cigars, and/or Pipes): Yes  Has patient been referred to the Quitline?: N/A patient is not a smoker  Patient has been referred for addiction treatment: N/A  Blakelynn Scheeler  CUEBAS-COLON, LCSW 11/21/2017, 1:35 PM

## 2017-12-02 ENCOUNTER — Ambulatory Visit: Payer: No Typology Code available for payment source

## 2018-03-07 ENCOUNTER — Other Ambulatory Visit: Payer: Self-pay

## 2018-03-07 ENCOUNTER — Emergency Department
Admission: EM | Admit: 2018-03-07 | Discharge: 2018-03-07 | Disposition: A | Discharge status: Home / Self Care | Payer: Self-pay | Attending: Emergency Medicine | Admitting: Emergency Medicine

## 2018-03-07 DIAGNOSIS — B349 Viral infection, unspecified: Secondary | ICD-10-CM | POA: Insufficient documentation

## 2018-03-07 DIAGNOSIS — N189 Chronic kidney disease, unspecified: Secondary | ICD-10-CM | POA: Insufficient documentation

## 2018-03-07 DIAGNOSIS — Z79899 Other long term (current) drug therapy: Secondary | ICD-10-CM | POA: Insufficient documentation

## 2018-03-07 DIAGNOSIS — F17210 Nicotine dependence, cigarettes, uncomplicated: Secondary | ICD-10-CM | POA: Insufficient documentation

## 2018-03-07 LAB — BASIC METABOLIC PANEL
Anion gap: 9 (ref 5–15)
BUN: 15 mg/dL (ref 6–20)
CHLORIDE: 101 mmol/L (ref 98–111)
CO2: 24 mmol/L (ref 22–32)
Calcium: 8.8 mg/dL — ABNORMAL LOW (ref 8.9–10.3)
Creatinine, Ser: 0.86 mg/dL (ref 0.61–1.24)
GFR calc non Af Amer: >60 mL/min (ref >60)
Glucose, Bld: 118 mg/dL — ABNORMAL HIGH (ref 70–99)
Potassium: 3.3 mmol/L — ABNORMAL LOW (ref 3.5–5.1)
SODIUM: 134 mmol/L — AB (ref 135–145)

## 2018-03-07 LAB — CBC WITH DIFFERENTIAL/PLATELET
Abs Immature Granulocytes: 0.04 10*3/uL (ref 0.00–0.07)
BASOS ABS: 0 10*3/uL (ref 0.0–0.1)
Basophils Relative: 0 %
EOS ABS: 0 10*3/uL (ref 0.0–0.5)
Eosinophils Relative: 0 %
HCT: 42.9 % (ref 39.0–52.0)
Hemoglobin: 14.8 g/dL (ref 13.0–17.0)
Immature Granulocytes: 1 %
LYMPHS PCT: 10 %
Lymphs Abs: 0.9 10*3/uL (ref 0.7–4.0)
MCH: 31.2 pg (ref 26.0–34.0)
MCHC: 34.5 g/dL (ref 30.0–36.0)
MCV: 90.3 fL (ref 80.0–100.0)
Monocytes Absolute: 0.9 10*3/uL (ref 0.1–1.0)
Monocytes Relative: 11 %
NEUTROS PCT: 78 %
NRBC: 0 % (ref 0.0–0.2)
Neutro Abs: 6.5 10*3/uL (ref 1.7–7.7)
Platelets: 161 10*3/uL (ref 150–400)
RBC: 4.75 MIL/uL (ref 4.22–5.81)
RDW: 12.6 % (ref 11.5–15.5)
WBC: 8.4 10*3/uL (ref 4.0–10.5)

## 2018-03-07 LAB — INFLUENZA PANEL BY PCR (TYPE A & B)
INFLAPCR: NEGATIVE
INFLBPCR: NEGATIVE

## 2018-03-07 LAB — GROUP A STREP BY PCR: Group A Strep by PCR: NOT DETECTED

## 2018-03-07 MED ORDER — ONDANSETRON 4 MG PO TBDP: 4.0000 mg | ORAL_TABLET | Freq: Three times a day (TID) | ORAL | 0 refills | Status: DC | PRN

## 2018-03-07 MED ORDER — KETOROLAC TROMETHAMINE 30 MG/ML IJ SOLN
30.0000 mg | Freq: Once | INTRAMUSCULAR | Status: AC
  Administered 2018-03-07: 30 mg via INTRAVENOUS
  Filled 2018-03-07: qty 1

## 2018-03-07 MED ORDER — SODIUM CHLORIDE 0.9 % IV BOLUS
1000.0000 mL | Freq: Once | INTRAVENOUS | Status: AC
  Administered 2018-03-07: 1000 mL via INTRAVENOUS

## 2018-03-07 NOTE — ED Provider Notes (Signed)
_________________________ 8:46 AM on 03/07/2018 -----------------------------------------  Labs and flu and strep all negative.  Patient remains well-appearing with normal work of breathing, tolerating p.o.  Presentation concerning for viral illness.  Will discharge home with increase oral hydration and Zofran for nausea and vomiting.  Discussed return precautions and close follow-up with primary care doctor.   Vitals:   03/07/18 0402 03/07/18 0822  BP: 109/66 113/65  Pulse: (!) 117 92  Resp: 18 17  Temp: 99.9 F (37.7 C) 99.6 F (37.6 C)  SpO2: 96% 98%      Nita Sickle, MD 03/07/18 [phone]

## 2018-03-07 NOTE — ED Notes (Signed)
Pt c/o bodyaches with a sore throat and fever for the past 3-4 days. Denies any cough or congestion.

## 2018-03-07 NOTE — ED Provider Notes (Signed)
[facility] Emergency Department Provider Note ____________________________________________   First MD Initiated Contact with Patient 03/07/18 [phone]     (approximate)  I have reviewed the triage vital signs and the nursing notes.   HISTORY  Chief Complaint Influenza    HPI Jimmy Richards is a 23 y.o. male with PMH as noted below who presents with fever, body aches, malaise, and nausea over the last 3 days.  It is associated with throat pain and nasal congestion.  The patient denies cough or vomiting.  He states he lives with his mother and has no sick contacts.  Past Medical History:  Diagnosis Date  . Anxiety   . Chronic kidney disease   . Depression     Patient Active Problem List   Diagnosis Date Noted  . 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 Overdose 11/15/2017  . Tobacco use disorder 11/15/2017  . Generalized anxiety disorder 09/04/2015  . PTSD (post-traumatic stress disorder) 09/04/2015  . Cannabis use disorder, severe, dependence (HCC) 09/04/2015  . Adolescent idiopathic scoliosis of thoracic region 11/10/2014  . Recurrent oral herpes simplex 11/10/2014    No past surgical history on file.  Prior to Admission medications   Medication Sig Start Date End Date Taking? Authorizing Provider  busPIRone (BUSPAR) 10 MG tablet Take 1 tablet (10 mg total) by mouth 3 (three) times daily. 11/21/17   Pucilowska, Jolanta B, MD  mirtazapine (REMERON) 30 MG tablet Take 1 tablet (30 mg total) by mouth at bedtime. 11/21/17   Pucilowska, Braulio Conte B, MD  QUEtiapine (SEROQUEL) 200 MG tablet Take 1 tablet (200 mg total) by mouth at bedtime. 11/21/17   Pucilowska, Braulio Conte B, MD  traZODone (DESYREL) 100 MG tablet Take 1 tablet (100 mg total) by mouth at bedtime as needed for sleep. 11/21/17   Pucilowska, Braulio Conte B, MD  venlafaxine XR (EFFEXOR-XR) 150 MG 24 hr capsule Take 1 capsule (150 mg total) by mouth daily  with breakfast. 11/22/17   Pucilowska, Ellin Goodie, MD    Allergies Patient has no known allergies.  Family History  Problem Relation Age of Onset  . Anxiety disorder Mother   . Depression Mother   . Anxiety disorder Father   . Depression Father   . Anxiety disorder Sister   . Depression Sister   . Anxiety disorder Brother   . Depression Brother   . Anxiety disorder Sister   . Depression Sister   . Anxiety disorder Sister   . Depression Sister   . Drug abuse Sister   . Anxiety disorder Sister   . Depression Sister   . Anxiety disorder Sister   . Depression Sister   . Anxiety disorder Sister   . Depression Sister   . Anxiety disorder Sister   . Depression Sister   . Anxiety disorder Sister   . Depression Sister     Social History Social History   Tobacco Use  . Smoking status: Current Every Day Smoker    Packs/day: 1.00    Years: 10.00    Pack years: 10.00    Types: Cigarettes  . Smokeless tobacco: Never Used  . Tobacco comment: refused  Substance Use Topics  . Alcohol use: Yes    Alcohol/week: 0.0 - 4.0 standard drinks    Comment: occassionally  . Drug use: Yes    Types: Marijuana    Comment: Daily    Review of Systems  Constitutional: Positive for fever. Eyes: No redness. ENT: Positive for sore  throat. Cardiovascular: Denies chest pain. Respiratory: Denies shortness of breath. Gastrointestinal: Positive for nausea.  Genitourinary: Positive for flank pain. Musculoskeletal: Positive for myalgias Skin: Negative for rash. Neurological: Positive for headache.   ____________________________________________   PHYSICAL EXAM:  VITAL SIGNS: ED Triage Vitals [03/07/18 0402]  Enc Vitals Group     BP 109/66     Pulse Rate (!) 117     Resp 18     Temp 99.9 F (37.7 C)     Temp Source Oral     SpO2 96 %     Weight 130 lb (59 kg)     Height 6' (1.829 m)     Head Circumference      Peak Flow      Pain Score 0     Pain Loc      Pain Edu?      Excl.  in GC?     Constitutional: Alert and oriented.  Tired appearing but in no acute distress. Eyes: Conjunctivae are normal.  Head: Atraumatic.  TMs normal bilaterally. Nose: No congestion/rhinnorhea. Mouth/Throat: Mucous membranes are somewhat dry.  Oropharynx clear with no erythema or exudate. Neck: Normal range of motion.  No lymphadenopathy. Cardiovascular: Normal rate, regular rhythm. Grossly normal heart sounds.  Good peripheral circulation. Respiratory: Normal respiratory effort.  No retractions. Lungs CTAB. Gastrointestinal: Soft and nontender. No distention.  Genitourinary: No flank tenderness. Musculoskeletal: Extremities warm and well perfused.  Neurologic:  Normal speech and language. No gross focal neurologic deficits are appreciated.  Skin:  Skin is warm and dry. No rash noted. Psychiatric: Mood and affect are normal. Speech and behavior are normal.  ____________________________________________   LABS (all labs ordered are listed, but only abnormal results are displayed)  Labs Reviewed  GROUP A STREP BY PCR  BASIC METABOLIC PANEL  CBC WITH DIFFERENTIAL/PLATELET  INFLUENZA PANEL BY PCR (TYPE A & B)   ____________________________________________  EKG   ____________________________________________  RADIOLOGY    ____________________________________________   PROCEDURES  Procedure(s) performed: No  Procedures  Critical Care performed: No ____________________________________________   INITIAL IMPRESSION / ASSESSMENT AND PLAN / ED COURSE  Pertinent labs & imaging results that were available during my care of the patient were reviewed by me and considered in my medical decision making (see chart for details).  23 year old male with PMH as noted above presents with fever, malaise, myalgias, and nausea over the last several days associated with sore throat and ear pain.  On exam the patient is relatively well-appearing.  He has borderline fever and  tachycardia but otherwise normal vital signs.  The remainder of the exam is relatively unremarkable.  Differential primarily includes influenza or other viral syndrome.  We will obtain flu swab, strep test, basic labs, give fluids and Toradol and reassess.  ----------------------------------------- 6:54 AM on 03/07/2018 -----------------------------------------  Work-up is pending.  I anticipate discharge home if the patient is feeling better after fluids and Toradol and has no concerning lab findings.  I am signing the patient out to the oncoming physician Dr. Don Perking. ____________________________________________   FINAL CLINICAL IMPRESSION(S) / ED DIAGNOSES  Final diagnoses:  Viral syndrome      NEW MEDICATIONS STARTED DURING THIS VISIT:  New Prescriptions   No medications on file     Note:  This document was prepared using Dragon voice recognition software and may include unintentional dictation errors.    Dionne Bucy, MD 03/07/18 [phone]

## 2018-03-07 NOTE — ED Triage Notes (Signed)
Patient to triage via wheelchair by EMS.  EMS reports flu like symptoms for 3 days. T- 102.1,  p -111, bp - 128.71, 98% pulse oxi on room air.

## 2018-12-13 ENCOUNTER — Emergency Department
Admission: EM | Admit: 2018-12-13 | Discharge: 2018-12-13 | Disposition: A | Discharge status: Home / Self Care | Payer: Self-pay | Attending: Emergency Medicine | Admitting: Emergency Medicine

## 2018-12-13 ENCOUNTER — Other Ambulatory Visit: Payer: Self-pay

## 2018-12-13 ENCOUNTER — Encounter: Payer: Self-pay | Admitting: Emergency Medicine

## 2018-12-13 DIAGNOSIS — F17210 Nicotine dependence, cigarettes, uncomplicated: Secondary | ICD-10-CM | POA: Insufficient documentation

## 2018-12-13 DIAGNOSIS — Z79899 Other long term (current) drug therapy: Secondary | ICD-10-CM | POA: Insufficient documentation

## 2018-12-13 DIAGNOSIS — K029 Dental caries, unspecified: Secondary | ICD-10-CM | POA: Insufficient documentation

## 2018-12-13 MED ORDER — TRAMADOL HCL 50 MG PO TABS: 50.0000 mg | ORAL_TABLET | Freq: Four times a day (QID) | ORAL | 0 refills | Status: AC | PRN

## 2018-12-13 MED ORDER — AMOXICILLIN 500 MG PO CAPS: 500.0000 mg | ORAL_CAPSULE | Freq: Three times a day (TID) | ORAL | 0 refills | Status: DC

## 2018-12-13 MED ORDER — IBUPROFEN 600 MG PO TABS: 600.0000 mg | ORAL_TABLET | Freq: Three times a day (TID) | ORAL | 0 refills | Status: DC | PRN

## 2018-12-13 MED ORDER — LIDOCAINE VISCOUS HCL 2 % MT SOLN: 5.0000 mL | Freq: Four times a day (QID) | OROMUCOSAL | 0 refills | Status: DC | PRN

## 2018-12-13 NOTE — ED Triage Notes (Addendum)
Patient ambulatory to triage with steady gait, without difficulty or distress noted, mask in place; pt brought in by EMS from home for c/o left lower dental pain today; percocet taken at 6am without relief

## 2018-12-13 NOTE — Discharge Instructions (Addendum)
May also follow-up from list of dental clinics provided: [place]  [place] [facility] and [facility] [email or web address] [phone])  [facility] [phone])  [facility] [phone] ext [phone])  [facility] [phone])  [facility] [phone]) This clinic caters to the indigent population and is on a lottery system. Location: [employer] [facility], [place], [place], [place] [facility] Hours: Wednesdays from 6pm - 9pm, patients seen by a lottery system. For dates, call or go to [email or web address] Services: Cleanings, fillings and simple extractions. Payment Options: DENTAL WORK IS FREE OF CHARGE. Bring proof of income or support. Best way to get seen: Arrive at 5:15 pm - this is a lottery, NOT first come/first serve, so arriving earlier will not increase your chances of being seen.     [facility] [phone] Select option 1 for emergencies   Location: [facility], [place], [address] [place] Clinic Hours: No walk-ins accepted - call the day before to schedule an appointment. Check in times are 9:30 am and 1:30 pm. Services: Simple extractions, temporary fillings, pulpectomy/pulp debridement, uncomplicated abscess drainage. Payment Options: PAYMENT IS DUE AT THE TIME OF SERVICE.  Fee is usually $100-200, additional surgical procedures (e.g. abscess drainage) may be extra. Cash, checks, Visa/MasterCard accepted.  Can file Medicaid if patient is covered for dental - patient should call case worker to check. No discount for [facility] patients. Best way to get seen: MUST call the day before and get onto the schedule. Can usually be seen the next 1-2 days. No walk-ins accepted.      [place] [phone]   Location: [place] [facility] Hours: M, W, Th, F 8am or 1:30pm, Tues 9a or 1:30 - first come/first served. Services: Simple extractions, temporary fillings, uncomplicated abscess drainage.  You do not need to be an [facility] resident. Payment Options: PAYMENT IS DUE AT THE TIME OF SERVICE. Dental insurance, otherwise sliding scale - bring proof of income or support. Depending on income and treatment needed, cost is usually $50-200. Best way to get seen: Arrive early as it is first come/first served.     [facility] [phone]   Location: [place] [facility] Hours: Mon-Thu 8a-5p Services: Most basic dental services including extractions and fillings. Payment Options: PAYMENT IS DUE AT THE TIME OF SERVICE. Sliding scale, up to 50% off - bring proof if income or support. Medicaid with dental option accepted. Best way to get seen: Call to schedule an appointment, can usually be seen within 2 weeks OR they will try to see walk-ins - show up at [place] or 2p (you may have to wait).     [facility] RESIDENTS ONLY   Location: [facility], [place] [address], [place], [place] [address] Clinic Hours: By appointment only. Monday - Thursday 8am-5pm, Friday 8am-12pm Services: Cleanings, fillings, extractions. Payment Options: PAYMENT IS DUE AT THE TIME OF SERVICE. Cash, Visa or MasterCard. Sliding scale - $30 minimum per service. Best way to get seen: Come in to office, complete packet and make an appointment - need proof of income or support monies for each household member and proof of [facility] residence. Usually takes about a month to get in.     [facility] Clinic [phone]   Location: [address], [place] [facility] Hours: Walk-in Urgent Care  Dental Services are offered Monday-Friday mornings only. The numbers of emergencies accepted daily is limited to the number of providers available. Maximum 15 - Mondays, Wednesdays & Thursdays Maximum 10 - Tuesdays & Fridays Services: You do not need to be a [facility] resident to be seen for a dental emergency. Emergencies are defined as pain, swelling, abnormal bleeding, or dental trauma. Walkins will receive x-rays if needed. NOTE: Dental cleaning is not an emergency. Payment Options: PAYMENT IS DUE AT THE TIME OF SERVICE. Minimum co-pay is $40.00 for uninsured patients. Minimum co-pay is $3.00 for Medicaid with dental coverage. Dental Insurance is accepted and must be presented at time of visit. Medicare does not cover dental. Forms of payment: Cash, credit card, checks. Best way to get seen: If not previously registered with the clinic, walk-in dental registration begins at 7:15 am and is on a first come/first serve basis. If previously registered with the clinic, call to make an appointment.     [facility] [place] ONLY   Location: [address], [place], [place] Clinic Hours: Mon-Thu 10a-2p Services: Extractions only! Payment Options: FREE (donations accepted) - bring proof of income or support Best way to get seen: Call and schedule an appointment OR come at 8am on the 1st Monday of every month (except for holidays) when it is first come/first served.     [facility] [phone]   Location: [place], [place] Clinic Hours: Friday mornings Services, Payment Options, Best way to get seen: Call for info

## 2018-12-13 NOTE — ED Provider Notes (Signed)
[facility] Emergency Department Provider Note   ____________________________________________   First MD Initiated Contact with Patient 12/13/18 0710     (approximate)  I have reviewed the triage vital signs and the nursing notes.   HISTORY  Chief Complaint Dental Pain    HPI Jimmy Richards is a 23 y.o. male patient presents with dental pain secondary to caries. Patient denies fever or edema. Patient state no relief taking Percocet this morning. States the pill was given by one of his friends. Patient rates his pain as a 4/10. Patient described the pain is "achy".         Past Medical History:  Diagnosis Date  . Anxiety   . Chronic kidney disease   . Depression     Patient Active Problem List   Diagnosis Date Noted  . Major depressive disorder, recurrent severe without psychotic features ([place]) 11/16/2017  . Overdose 11/15/2017  . Tobacco use disorder 11/15/2017  . Generalized anxiety disorder 09/04/2015  . PTSD (post-traumatic stress disorder) 09/04/2015  . Cannabis use disorder, severe, dependence ([place]) 09/04/2015  . Adolescent idiopathic scoliosis of thoracic region 11/10/2014  . Recurrent oral herpes simplex 11/10/2014    History reviewed. No pertinent surgical history.  Prior to Admission medications   Medication Sig Start Date End Date Taking? Authorizing Provider  amoxicillin (AMOXIL) 500 MG capsule Take 1 capsule (500 mg total) by mouth 3 (three) times daily. 12/13/18   Sable Feil, PA-C  busPIRone (BUSPAR) 10 MG tablet Take 1 tablet (10 mg total) by mouth 3 (three) times daily. 11/21/17   Pucilowska, Herma Ard B, MD  ibuprofen (ADVIL) 600 MG tablet Take 1 tablet (600 mg total) by mouth every 8 (eight) hours as needed. 12/13/18   Sable Feil, PA-C  lidocaine (XYLOCAINE) 2 % solution Use as directed 5 mLs in the mouth or throat every 6 (six) hours as needed for mouth pain. 12/13/18   Sable Feil, PA-C  mirtazapine (REMERON)  30 MG tablet Take 1 tablet (30 mg total) by mouth at bedtime. 11/21/17   Pucilowska, Herma Ard B, MD  QUEtiapine (SEROQUEL) 200 MG tablet Take 1 tablet (200 mg total) by mouth at bedtime. 11/21/17   Pucilowska, Wardell Honour, MD  traMADol (ULTRAM) 50 MG tablet Take 1 tablet (50 mg total) by mouth every 6 (six) hours as needed for up to 3 days. 12/13/18 12/16/18  Sable Feil, PA-C  traZODone (DESYREL) 100 MG tablet Take 1 tablet (100 mg total) by mouth at bedtime as needed for sleep. 11/21/17   Pucilowska, Herma Ard B, MD  venlafaxine XR (EFFEXOR-XR) 150 MG 24 hr capsule Take 1 capsule (150 mg total) by mouth daily with breakfast. 11/22/17   Pucilowska, Wardell Honour, MD    Allergies Patient has no known allergies.  Family History  Problem Relation Age of Onset  . Anxiety disorder Mother   . Depression Mother   . Anxiety disorder Father   . Depression Father   . Anxiety disorder Sister   . Depression Sister   . Anxiety disorder Brother   . Depression Brother   . Anxiety disorder Sister   . Depression Sister   . Anxiety disorder Sister   . Depression Sister   . Drug abuse Sister   . Anxiety disorder Sister   . Depression Sister   . Anxiety disorder Sister   . Depression Sister   . Anxiety disorder Sister   . Depression Sister   . Anxiety disorder Sister   .  Depression Sister   . Anxiety disorder Sister   . Depression Sister     Social History Social History   Tobacco Use  . Smoking status: Current Every Day Smoker    Packs/day: 1.00    Years: 10.00    Pack years: 10.00    Types: Cigarettes  . Smokeless tobacco: Never Used  . Tobacco comment: refused  Substance Use Topics  . Alcohol use: Yes    Alcohol/week: 0.0 - 4.0 standard drinks    Comment: occassionally  . Drug use: Yes    Types: Marijuana    Comment: Daily    Review of Systems Constitutional: No fever/chills Eyes: No visual changes. ENT: No sore throat. Dental pain Cardiovascular: Denies chest pain. Respiratory:  Denies shortness of breath. Gastrointestinal: No abdominal pain.  No nausea, no vomiting.  No diarrhea.  No constipation. Genitourinary: Negative for dysuria. Musculoskeletal: Negative for back pain. Skin: Negative for rash. Neurological: Negative for headaches, focal weakness or numbness. Psychiatric: Anxiety and depression    ____________________________________________   PHYSICAL EXAM:  VITAL SIGNS: ED Triage Vitals  Enc Vitals Group     BP 12/13/18 0653 (!) 128/94     Pulse Rate 12/13/18 0653 88     Resp 12/13/18 0653 18     Temp 12/13/18 0653 98.2 F (36.8 C)     Temp Source 12/13/18 0653 Oral     SpO2 12/13/18 0653 97 %     Weight 12/13/18 0652 140 lb (63.5 kg)     Height 12/13/18 0652 6' (1.829 m)     Head Circumference --      Peak Flow --      Pain Score 12/13/18 0653 4     Pain Loc --      Pain Edu? --      Excl. in GC? --    Constitutional: Alert and oriented. Well appearing and in no acute distress. Mouth/Throat: Mucous membranes are moist.  Oropharynx non-erythematous. Dental caries at tooth #17 Neck: No stridor.  Hematological/Lymphatic/Immunilogical: No cervical lymphadenopathy. Cardiovascular: Normal rate, regular rhythm. Grossly normal heart sounds.  Good peripheral circulation. Respiratory: Normal respiratory effort.  No retractions. Lungs CTAB. Skin:  Skin is warm, dry and intact. No rash noted.  ____________________________________________   LABS (all labs ordered are listed, but only abnormal results are displayed)  Labs Reviewed - No data to display ____________________________________________  EKG   ____________________________________________  RADIOLOGY  ED MD interpretation:    Official radiology report(s): No results found.  ____________________________________________   PROCEDURES  Procedure(s) performed (including Critical Care):  Procedures   ____________________________________________   INITIAL IMPRESSION /  ASSESSMENT AND PLAN / ED COURSE  As part of my medical decision making, I reviewed the following data within the electronic MEDICAL RECORD NUMBER     Patient presents with dental pain secondary caries. Patient given discharge care instruction list of dental clinics for follow-up care.    Jimmy Richards was evaluated in Emergency Department on 12/13/2018 for the symptoms described in the history of present illness. He was evaluated in the context of the global COVID-19 pandemic, which necessitated consideration that the patient might be at risk for infection with the SARS-CoV-2 virus that causes COVID-19. Institutional protocols and algorithms that pertain to the evaluation of patients at risk for COVID-19 are in a state of rapid change based on information released by regulatory bodies including the CDC and federal and state organizations. These policies and algorithms were followed during the patient's care in the ED.  ____________________________________________   FINAL CLINICAL IMPRESSION(S) / ED DIAGNOSES  Final diagnoses:  Pain due to dental caries     ED Discharge Orders         Ordered    amoxicillin (AMOXIL) 500 MG capsule  3 times daily     12/13/18 0722    ibuprofen (ADVIL) 600 MG tablet  Every 8 hours PRN     12/13/18 0722    traMADol (ULTRAM) 50 MG tablet  Every 6 hours PRN     12/13/18 0722    lidocaine (XYLOCAINE) 2 % solution  Every 6 hours PRN     12/13/18 [id number]           Note:  This document was prepared using Dragon voice recognition software and may include unintentional dictation errors.    Joni ReiningSmith, Hezekiah Veltre K, PA-C 12/13/18 [id number]    Chesley NoonJessup, Charles, MD 12/14/18 1728

## 2018-12-27 DIAGNOSIS — K862 Cyst of pancreas: Secondary | ICD-10-CM | POA: Insufficient documentation

## 2019-04-13 DIAGNOSIS — Z87891 Personal history of nicotine dependence: Secondary | ICD-10-CM | POA: Insufficient documentation

## 2019-04-19 DIAGNOSIS — Z8659 Personal history of other mental and behavioral disorders: Secondary | ICD-10-CM | POA: Insufficient documentation

## 2019-04-19 DIAGNOSIS — Z87442 Personal history of urinary calculi: Secondary | ICD-10-CM | POA: Insufficient documentation

## 2019-04-19 DIAGNOSIS — N200 Calculus of kidney: Secondary | ICD-10-CM | POA: Insufficient documentation

## 2020-01-31 ENCOUNTER — Emergency Department: Payer: Self-pay

## 2020-01-31 ENCOUNTER — Other Ambulatory Visit: Payer: Self-pay

## 2020-01-31 ENCOUNTER — Encounter: Payer: Self-pay | Admitting: Emergency Medicine

## 2020-01-31 ENCOUNTER — Emergency Department
Admission: EM | Admit: 2020-01-31 | Discharge: 2020-01-31 | Disposition: A | Discharge status: Home / Self Care | Payer: Self-pay | Attending: Emergency Medicine | Admitting: Emergency Medicine

## 2020-01-31 DIAGNOSIS — N201 Calculus of ureter: Secondary | ICD-10-CM

## 2020-01-31 DIAGNOSIS — F17210 Nicotine dependence, cigarettes, uncomplicated: Secondary | ICD-10-CM | POA: Insufficient documentation

## 2020-01-31 DIAGNOSIS — R112 Nausea with vomiting, unspecified: Secondary | ICD-10-CM | POA: Insufficient documentation

## 2020-01-31 DIAGNOSIS — R109 Unspecified abdominal pain: Secondary | ICD-10-CM | POA: Insufficient documentation

## 2020-01-31 DIAGNOSIS — N189 Chronic kidney disease, unspecified: Secondary | ICD-10-CM | POA: Insufficient documentation

## 2020-01-31 LAB — URINALYSIS, COMPLETE (UACMP) WITH MICROSCOPIC
Bacteria, UA: NONE SEEN
Bilirubin Urine: NEGATIVE
Glucose, UA: NEGATIVE mg/dL
Ketones, ur: NEGATIVE mg/dL
Leukocytes,Ua: NEGATIVE
Nitrite: NEGATIVE
Protein, ur: NEGATIVE mg/dL
Specific Gravity, Urine: 1.025 (ref 1.005–1.030)
pH: 5 (ref 5.0–8.0)

## 2020-01-31 LAB — BASIC METABOLIC PANEL
Anion gap: 9 (ref 5–15)
BUN: 19 mg/dL (ref 6–20)
CO2: 25 mmol/L (ref 22–32)
Calcium: 8.8 mg/dL — ABNORMAL LOW (ref 8.9–10.3)
Chloride: 103 mmol/L (ref 98–111)
Creatinine, Ser: 0.92 mg/dL (ref 0.61–1.24)
GFR, Estimated: >60 mL/min (ref >60)
Glucose, Bld: 132 mg/dL — ABNORMAL HIGH (ref 70–99)
Potassium: 3.9 mmol/L (ref 3.5–5.1)
Sodium: 137 mmol/L (ref 135–145)

## 2020-01-31 LAB — CBC
HCT: 46.8 % (ref 39.0–52.0)
Hemoglobin: 15.8 g/dL (ref 13.0–17.0)
MCH: 31.3 pg (ref 26.0–34.0)
MCHC: 33.8 g/dL (ref 30.0–36.0)
MCV: 92.7 fL (ref 80.0–100.0)
Platelets: 239 10*3/uL (ref 150–400)
RBC: 5.05 MIL/uL (ref 4.22–5.81)
RDW: 13.4 % (ref 11.5–15.5)
WBC: 10.4 10*3/uL (ref 4.0–10.5)
nRBC: 0.2 % (ref 0.0–0.2)

## 2020-01-31 MED ORDER — ONDANSETRON HCL 4 MG/2ML IJ SOLN
4.0000 mg | Freq: Once | INTRAMUSCULAR | Status: AC
  Administered 2020-01-31: 4 mg via INTRAVENOUS
  Filled 2020-01-31: qty 2

## 2020-01-31 MED ORDER — KETOROLAC TROMETHAMINE 30 MG/ML IJ SOLN
15.0000 mg | Freq: Once | INTRAMUSCULAR | Status: AC
  Administered 2020-01-31: 15 mg via INTRAVENOUS
  Filled 2020-01-31: qty 1

## 2020-01-31 MED ORDER — ACETAMINOPHEN 500 MG PO TABS
1000.0000 mg | ORAL_TABLET | Freq: Once | ORAL | Status: AC
  Administered 2020-01-31: 1000 mg via ORAL
  Filled 2020-01-31: qty 2

## 2020-01-31 MED ORDER — MORPHINE SULFATE (PF) 4 MG/ML IV SOLN
4.0000 mg | Freq: Once | INTRAVENOUS | Status: AC
  Administered 2020-01-31: 4 mg via INTRAVENOUS
  Filled 2020-01-31: qty 1

## 2020-01-31 MED ORDER — KETOROLAC TROMETHAMINE 10 MG PO TABS: 10.0000 mg | ORAL_TABLET | Freq: Four times a day (QID) | ORAL | 0 refills | Status: DC | PRN

## 2020-01-31 MED ORDER — OXYCODONE-ACETAMINOPHEN 5-325 MG PO TABS: 1.0000 | ORAL_TABLET | Freq: Four times a day (QID) | ORAL | 0 refills | Status: AC | PRN

## 2020-01-31 MED ORDER — FENTANYL CITRATE (PF) 100 MCG/2ML IJ SOLN
50.0000 ug | INTRAMUSCULAR | Status: DC | PRN
  Administered 2020-01-31: 50 ug via INTRAVENOUS
  Filled 2020-01-31: qty 2

## 2020-01-31 NOTE — ED Triage Notes (Signed)
Pt to ED via EMS from home c/o left flank pain that started suddenly tonight, patient with nausea and vomiting when arrived to ED.  States hx of kidney stones and this feels similar.  Denies changes in urination.

## 2020-01-31 NOTE — Discharge Instructions (Signed)
Your urine test showed a small amount of red blood cells in your urine.  This is very likely to be due to the kidney stone, but you should follow up with primary care in 1 week when your symptoms are resolved to have a repeat urine test and ensure it is back to normal.

## 2020-01-31 NOTE — ED Provider Notes (Signed)
Procedures  Clinical Course as of 01/31/20 0810  Wed Jan 31, 2020  0552 DG Abdomen 1 View Excessive stool burden limits the ability to see a stone on the radiograph.  The patient was screaming in pain earlier just prior to getting his morphine and Toradol but after he got the fentanyl.  Given the severity of the patient's pain, I will obtain a CT scan to look for any evidence of obstructive uropathy or other explanation for the patient's severe stabbing flank pain. [CF]  0700 Transferring care to Dr. Scotty Court to follow up on CT results and reassess the patient.  Anticipate discharge if pain can be controlled and if there are no emergent findings on CT scan. [CF]    Clinical Course User Index [CF] Loleta Rose, MD    ----------------------------------------- 8:10 AM on 01/31/2020 -----------------------------------------  CT shows a 3 mm stone at the left UVJ.  Urinalysis negative for signs of infection.  Vital signs are normal.  Patient is calm, still in pain but reasonably well controlled.  We will send a prescription for Toradol and Percocet to [facility] pharmacy for him, counseled on fluid intake, expecting imminent passage of the stone.  Recommend follow-up in 1 week for recheck of urine due to microscopic hematuria.   Sharman Cheek, MD 01/31/20 [phone]

## 2020-01-31 NOTE — ED Provider Notes (Signed)
[facility] Emergency Department Provider Note  ____________________________________________   Event Date/Time   First MD Initiated Contact with Patient 01/31/20 [phone]     (approximate)  I have reviewed the triage vital signs and the nursing notes.   HISTORY  Chief Complaint Flank Pain    HPI Jimmy Richards is a 25 y.o. male with medical and psychiatric history as listed below and he states he has had at least 5 episodes of kidney stones in the past.  He presents tonight for acute onset and severe sharp stabbing pain in his left flank associated with acute onset nausea and vomiting.  The pain seems to come in waves and when it is happening he cannot find a position of comfort.  Nothing particular makes it better or worse.  He denies fever, sore throat, chest pain, shortness of breath, dysuria, and hematuria.  He has not urinated since the onset of the pain.  It feels similar to prior kidney stone episodes.         Past Medical History:  Diagnosis Date  . Anxiety   . Chronic kidney disease   . Depression     Patient Active Problem List   Diagnosis Date Noted  . 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 Overdose 11/15/2017  . Tobacco use disorder 11/15/2017  . Generalized anxiety disorder 09/04/2015  . PTSD (post-traumatic stress disorder) 09/04/2015  . Cannabis use disorder, severe, dependence ([facility]) 09/04/2015  . Adolescent idiopathic scoliosis of thoracic region 11/10/2014  . Recurrent oral herpes simplex 11/10/2014    History reviewed. No pertinent surgical history.  Prior to Admission medications   Medication Sig Start Date End Date Taking? Authorizing Provider  amoxicillin (AMOXIL) 500 MG capsule Take 1 capsule (500 mg total) by mouth 3 (three) times daily. 12/13/18   Joni ReiningSmith, Ronald K, PA-C  busPIRone (BUSPAR) 10 MG tablet Take 1 tablet (10 mg total) by mouth 3 (three) times daily. 11/21/17    Pucilowska, Braulio ConteJolanta B, MD  ibuprofen (ADVIL) 600 MG tablet Take 1 tablet (600 mg total) by mouth every 8 (eight) hours as needed. 12/13/18   Joni ReiningSmith, Ronald K, PA-C  lidocaine (XYLOCAINE) 2 % solution Use as directed 5 mLs in the mouth or throat every 6 (six) hours as needed for mouth pain. 12/13/18   Joni ReiningSmith, Ronald K, PA-C  mirtazapine (REMERON) 30 MG tablet Take 1 tablet (30 mg total) by mouth at bedtime. 11/21/17   Pucilowska, Braulio ConteJolanta B, MD  QUEtiapine (SEROQUEL) 200 MG tablet Take 1 tablet (200 mg total) by mouth at bedtime. 11/21/17   Pucilowska, Braulio ConteJolanta B, MD  traZODone (DESYREL) 100 MG tablet Take 1 tablet (100 mg total) by mouth at bedtime as needed for sleep. 11/21/17   Pucilowska, Braulio ConteJolanta B, MD  venlafaxine XR (EFFEXOR-XR) 150 MG 24 hr capsule Take 1 capsule (150 mg total) by mouth daily with breakfast. 11/22/17   Pucilowska, Ellin GoodieJolanta B, MD    Allergies Patient has no known allergies.  Family History  Problem Relation Age of Onset  . Anxiety disorder Mother   . Depression Mother   . Anxiety disorder Father   . Depression Father   . Anxiety disorder Sister   . Depression Sister   . Anxiety disorder Brother   . Depression Brother   . Anxiety disorder Sister   . Depression Sister   . Anxiety disorder Sister   . Depression Sister   . Drug abuse Sister   . Anxiety disorder Sister   .  Depression Sister   . Anxiety disorder Sister   . Depression Sister   . Anxiety disorder Sister   . Depression Sister   . Anxiety disorder Sister   . Depression Sister   . Anxiety disorder Sister   . Depression Sister     Social History Social History   Tobacco Use  . Smoking status: Current Every Day Smoker    Packs/day: 1.00    Years: 10.00    Pack years: 10.00    Types: Cigarettes  . Smokeless tobacco: Never Used  . Tobacco comment: refused  Vaping Use  . Vaping Use: Never used  Substance Use Topics  . Alcohol use: Yes    Alcohol/week: 0.0 - 4.0 standard drinks    Comment:  occassionally  . Drug use: Yes    Types: Marijuana    Comment: Daily    Review of Systems Constitutional: No fever/chills Eyes: No visual changes. ENT: No sore throat. Cardiovascular: Denies chest pain. Respiratory: Denies shortness of breath. Gastrointestinal: Sharp stabbing left flank pain but no abdominal pain in particular.  Positive for nausea and vomiting. Genitourinary: Negative for dysuria. Musculoskeletal: Sharp stabbing left flank pain. Integumentary: Negative for rash. Neurological: Negative for headaches, focal weakness or numbness.   ____________________________________________   PHYSICAL EXAM:  VITAL SIGNS: ED Triage Vitals  Enc Vitals Group     BP 01/31/20 0514 (!) 114/101     Pulse Rate 01/31/20 0514 (!) 51     Resp 01/31/20 0514 18     Temp --      Temp src --      SpO2 01/31/20 0514 100 %     Weight 01/31/20 0509 59 kg (130 lb)     Height 01/31/20 0509 1.778 m (5\' 10" )     Head Circumference --      Peak Flow --      Pain Score 01/31/20 0509 10     Pain Loc --      Pain Edu? --      Excl. in GC? --     Constitutional: Alert and oriented.  Eyes: Conjunctivae are normal.  Head: Atraumatic. Nose: No congestion/rhinnorhea. Mouth/Throat: Patient is wearing a mask. Neck: No stridor.  No meningeal signs.   Cardiovascular: Normal rate, regular rhythm. Good peripheral circulation. Respiratory: Normal respiratory effort.  No retractions. Gastrointestinal: Soft with some tenderness to palpation of the left lower quadrant but seems to be extending from the left flank.  No rebound or guarding. Musculoskeletal: Left CVA tenderness to percussion.  No lower extremity tenderness nor edema. No gross deformities of extremities. Neurologic:  Normal speech and language. No gross focal neurologic deficits are appreciated.  Skin:  Skin is warm, dry and intact. Psychiatric: Mood and affect are normal. Speech and behavior are  normal.  ____________________________________________   LABS (all labs ordered are listed, but only abnormal results are displayed)  Labs Reviewed  BASIC METABOLIC PANEL - Abnormal; Notable for the following components:      Result Value   Glucose, Bld 132 (*)    Calcium 8.8 (*)    All other components within normal limits  CBC  URINALYSIS, COMPLETE (UACMP) WITH MICROSCOPIC   ____________________________________________  EKG  No indication for emergent EKG ____________________________________________  RADIOLOGY I, 03/30/20, personally viewed and evaluated these images (plain radiographs) as part of my medical decision making, as well as reviewing the written report by the radiologist.  ED MD interpretation: Extensive stool noted throughout the colon on abdominal x-ray.  CT scan (renal protocol) pending at the time of signout to Dr. Scotty Court.  Official radiology report(s): DG Abdomen 1 View  Result Date: 01/31/2020 CLINICAL DATA:  Left flank pain.  History of kidney stones. EXAM: ABDOMEN - 1 VIEW COMPARISON:  CT 08/11/2012. FINDINGS: Soft tissue structures are unremarkable. No bowel distention. Large amount of stool noted throughout the colon making evaluation for renal stone disease difficult. No free air. Punctate bilateral renal stones cannot be excluded. If symptoms persist CT can be obtained. No acute bony abnormality. IMPRESSION: Stool noted throughout the colon making evaluation for stone disease difficult. Punctate bilateral renal stones cannot be excluded. If further evaluation is needed CT can be obtained. Electronically Signed   By: Maisie Fus  Register   On: 01/31/2020 06:22   ____________________________________________   PROCEDURES   Procedure(s) performed (including Critical Care):  Procedures   ____________________________________________   INITIAL IMPRESSION / MDM / ASSESSMENT AND PLAN / ED COURSE  As part of my medical decision making, I reviewed the  following data within the electronic MEDICAL RECORD NUMBER Nursing notes reviewed and incorporated, Labs reviewed , Old chart reviewed, Patient signed out to Dr. Scotty Court, Radiograph reviewed , Notes from prior ED visits and  Controlled Substance Database   Differential diagnosis includes, but is not limited to, renal/ureteral colic, obstructive uropathy, UTI/pyelonephritis, musculoskeletal strain.  I reviewed the [place] controlled substance database and he has no controlled substances prescribed within the last 2 years.  He received fentanyl 50 mcg IV while he was being triaged due to his severe pain.  He initially felt better during my interview with him but then the pain hit him again and he was crying out and appears very uncomfortable, most consistent with renal colic.  CBC is normal, BMP is essentially normal as well.  He has not been able to provide a urine specimen.  He appears to be in severe pain so I ordered Toradol 15 mg IV and morphine 4 mg IV.  Although he says he has had 5 episodes of kidney stones in the past he has never required any urological intervention.  I will attempt to identify a stone with an abdominal x-ray given his multiple episodes in the past.       Clinical Course as of 01/31/20 0732  Wed Jan 31, 2020  0552 DG Abdomen 1 View Excessive stool burden limits the ability to see a stone on the radiograph.  The patient was screaming in pain earlier just prior to getting his morphine and Toradol but after he got the fentanyl.  Given the severity of the patient's pain, I will obtain a CT scan to look for any evidence of obstructive uropathy or other explanation for the patient's severe stabbing flank pain. [CF]  0700 Transferring care to Dr. Scotty Court to follow up on CT results and reassess the patient.  Anticipate discharge if pain can be controlled and if there are no emergent findings on CT scan. [CF]    Clinical Course User Index [CF] Loleta Rose, MD      ____________________________________________  FINAL CLINICAL IMPRESSION(S) / ED DIAGNOSES  Final diagnoses:  Left flank pain     MEDICATIONS GIVEN DURING THIS VISIT:  Medications  fentaNYL (SUBLIMAZE) injection 50 mcg (50 mcg Intravenous Given 01/31/20 0527)  ondansetron (ZOFRAN) injection 4 mg (4 mg Intravenous Given 01/31/20 0526)  morphine 4 MG/ML injection 4 mg (4 mg Intravenous Given 01/31/20 0612)  ketorolac (TORADOL) 30 MG/ML injection 15 mg (15 mg Intravenous Given 01/31/20 0613)  ED Discharge Orders    None      *Please note:  Chigozie Basaldua was evaluated in Emergency Department on 01/31/2020 for the symptoms described in the history of present illness. He was evaluated in the context of the global COVID-19 pandemic, which necessitated consideration that the patient might be at risk for infection with the SARS-CoV-2 virus that causes COVID-19. Institutional protocols and algorithms that pertain to the evaluation of patients at risk for COVID-19 are in a state of rapid change based on information released by regulatory bodies including the CDC and federal and state organizations. These policies and algorithms were followed during the patient's care in the ED.  Some ED evaluations and interventions may be delayed as a result of limited staffing during and after the pandemic.*  Note:  This document was prepared using Dragon voice recognition software and may include unintentional dictation errors.   Loleta Rose, MD 01/31/20 [phone]

## 2020-05-07 ENCOUNTER — Emergency Department
Admission: EM | Admit: 2020-05-07 | Discharge: 2020-05-07 | Disposition: A | Discharge status: Home / Self Care | Payer: Medicaid Other | Attending: Emergency Medicine | Admitting: Emergency Medicine

## 2020-05-07 ENCOUNTER — Encounter: Payer: Self-pay | Admitting: Emergency Medicine

## 2020-05-07 DIAGNOSIS — N189 Chronic kidney disease, unspecified: Secondary | ICD-10-CM | POA: Insufficient documentation

## 2020-05-07 DIAGNOSIS — K0889 Other specified disorders of teeth and supporting structures: Secondary | ICD-10-CM | POA: Insufficient documentation

## 2020-05-07 DIAGNOSIS — F17210 Nicotine dependence, cigarettes, uncomplicated: Secondary | ICD-10-CM | POA: Insufficient documentation

## 2020-05-07 MED ORDER — IBUPROFEN 800 MG PO TABS
800.0000 mg | ORAL_TABLET | Freq: Once | ORAL | Status: AC
  Administered 2020-05-07: 800 mg via ORAL
  Filled 2020-05-07: qty 1

## 2020-05-07 MED ORDER — LIDOCAINE VISCOUS HCL 2 % MT SOLN
15.0000 mL | Freq: Once | OROMUCOSAL | Status: AC
  Administered 2020-05-07: 15 mL via OROMUCOSAL
  Filled 2020-05-07: qty 15

## 2020-05-07 NOTE — ED Triage Notes (Signed)
Pt arrived via EMS from home due to left jaw/dental pain to the bottom left back area x3 days. Pt denies fever, sensitivity with PO fluids or food.

## 2020-05-07 NOTE — Discharge Instructions (Signed)
OPTIONS FOR DENTAL FOLLOW UP CARE ° °[place] Department of Health and Human Services - Local Safety Net Dental Clinics °http://www.ncdhhs.gov/dph/oralhealth/services/safetynetclinics.htm °  °[facility] (336-562-3123) ° °[facility] [place] ([phone]) ° °[facility] ([phone] ext [phone]) ° °[facility] ([phone]) ° °[facility] ([phone]) °This clinic caters to the indigent population and is on a lottery system. °Location: °[facility], [address], [place] °Clinic Hours: °Wednesdays from 6pm - 9pm, patients seen by a lottery system. °For dates, call or go to [email or web address].[facility].edu/[facility]/patients/Dental-[facility] °Services: °Cleanings, fillings and simple extractions. °Payment Options: °DENTAL WORK IS FREE OF CHARGE. Bring proof of income or support. °Best way to get seen: °Arrive at 5:15 pm - this is a lottery, NOT first come/first serve, so arriving earlier will not increase your chances of being seen. °  °  °[facility] °[phone] °Select option 1 for emergencies °  °Location: °[facility], [address], [place] °Clinic Hours: °No walk-ins accepted - call the day before to schedule an appointment. °Check in times are 9:30 am and 1:30 pm. °Services: °Simple extractions, temporary fillings, pulpectomy/pulp debridement, uncomplicated abscess drainage. °Payment Options: °PAYMENT IS DUE AT THE TIME OF SERVICE.  Fee is usually $100-200, additional surgical procedures (e.g. abscess drainage) may be extra. °Cash, checks, Visa/MasterCard accepted.  Can file Medicaid if patient is covered for dental - patient should call case worker to check. °No discount for [facility] patients. °Best way to get seen: °MUST call the day before and get onto the schedule. Can usually be seen the next 1-2 days. No walk-ins accepted. °  °  °[facility] °[phone] °   °Location: °[facility], [address], [place] °Clinic Hours: °M, W, Th, F 8am or 1:30pm, Tues 9a or 1:30 - first come/first served. °Services: °Simple extractions, temporary fillings, uncomplicated abscess drainage.  You do not need to be an [place] resident. °Payment Options: °PAYMENT IS DUE AT THE TIME OF SERVICE. °Dental insurance, otherwise sliding scale - bring proof of income or support. °Depending on income and treatment needed, cost is usually $50-200. °Best way to get seen: °Arrive early as it is first come/first served. °  °  °[facility] °[phone] °  °Location: °[address] °Clinic Hours: °Mon-Thu 8a-5p °Services: °Most basic dental services including extractions and fillings. °Payment Options: °PAYMENT IS DUE AT THE TIME OF SERVICE. °Sliding scale, up to 50% off - bring proof if income or support. °Medicaid with dental option accepted. °Best way to get seen: °Call to schedule an appointment, can usually be seen within 2 weeks OR they will try to see walk-ins - show up at 8a or 2p (you may have to wait). °  °  °[facility] °[phone] °[place] RESIDENTS ONLY °  °Location: °[facility], [address], [place], [place] [address] °Clinic Hours: By appointment only. °Monday - Thursday 8am-5pm, Friday 8am-12pm °Services: Cleanings, fillings, extractions. °Payment Options: °PAYMENT IS DUE AT THE TIME OF SERVICE. °Cash, Visa or MasterCard. Sliding scale - $30 minimum per service. °Best way to get seen: °Come in to office, complete packet and make an appointment - need proof of income °or support monies for each household member and proof of [place] residence. °Usually takes about a month to get in. °  °  °[facility] °[phone] °  °Location: °[address]   [place] °Clinic Hours: Walk-in Urgent Care Dental Services are offered Monday-Friday  mornings only. °The numbers of emergencies accepted daily is limited to the number of °providers available. °Maximum 15 - Mondays, Wednesdays & Thursdays °Maximum 10 - Tuesdays & Fridays °Services: °You do not need to be a [place] resident to be seen for a dental emergency. °Emergencies are defined as pain, swelling, abnormal bleeding, or dental trauma. Walkins will receive x-rays if needed. °NOTE: Dental cleaning is not an emergency. °Payment Options: °PAYMENT IS DUE AT THE TIME OF SERVICE. °Minimum co-pay is $40.00 for uninsured patients. °Minimum co-pay is $3.00 for Medicaid with dental coverage. °Dental Insurance is accepted and must be presented at time of visit. °Medicare does not cover dental. °Forms of payment: Cash, credit card, checks. °Best way to get seen: °If not previously registered with the clinic, walk-in dental registration begins at 7:15 am and is on a first come/first serve basis. °If previously registered with the clinic, call to make an appointment. °  °  °[facility] °[phone] °[place] RESIDENTS ONLY °  °Location: °[address], [place], [place] °Clinic Hours: °Mon-Thu 10a-2p °Services: Extractions only! °Payment Options: °FREE (donations accepted) - bring proof of income or support °Best way to get seen: °Call and schedule an appointment OR come at 8am on the 1st Monday of every month (except for holidays) when it is first come/first served. °  °  °[facility] °[phone] °  °Location: °[address], [place] °Clinic Hours: °Friday mornings °Services, Payment Options, Best way to get seen: °Call for info °

## 2020-05-07 NOTE — ED Provider Notes (Signed)
[facility] Emergency Department Provider Note  ____________________________________________  Time seen: Approximately 5:09 AM  I have reviewed the triage vital signs and the nursing notes.   HISTORY  Chief Complaint Dental Pain   HPI Jimmy Richards is a 25 y.o. male with a history of anxiety, depression, PTSD who presents via EMS for dental pain.  Patient reports 3 days of left lower molar pain.  He reports having a cavity there.  He denies any trauma.  Has not seen the dentist.  Has tried ibuprofen at home with no significant relief.  Denies any fever, trismus, neck pain or swelling.  Patient reports that the pain was severe this evening which prompted him to come to the emergency room.   Past Medical History:  Diagnosis Date  . Anxiety   . Chronic kidney disease   . Depression     Patient Active Problem List   Diagnosis Date Noted  . 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 Overdose 11/15/2017  . Tobacco use disorder 11/15/2017  . Generalized anxiety disorder 09/04/2015  . PTSD (post-traumatic stress disorder) 09/04/2015  . Cannabis use disorder, severe, dependence (HCC) 09/04/2015  . Adolescent idiopathic scoliosis of thoracic region 11/10/2014  . Recurrent oral herpes simplex 11/10/2014    History reviewed. No pertinent surgical history.  Prior to Admission medications   Medication Sig Start Date End Date Taking? Authorizing Provider  amoxicillin (AMOXIL) 500 MG capsule Take 1 capsule (500 mg total) by mouth 3 (three) times daily. 12/13/18   Joni Reining, PA-C  busPIRone (BUSPAR) 10 MG tablet Take 1 tablet (10 mg total) by mouth 3 (three) times daily. 11/21/17   Pucilowska, Braulio Conte B, MD  ibuprofen (ADVIL) 600 MG tablet Take 1 tablet (600 mg total) by mouth every 8 (eight) hours as needed. 12/13/18   Joni Reining, PA-C  ketorolac (TORADOL) 10 MG tablet Take 1 tablet (10 mg total) by  mouth every 6 (six) hours as needed for moderate pain. 01/31/20   Sharman Cheek, MD  lidocaine (XYLOCAINE) 2 % solution Use as directed 5 mLs in the mouth or throat every 6 (six) hours as needed for mouth pain. 12/13/18   Joni Reining, PA-C  mirtazapine (REMERON) 30 MG tablet Take 1 tablet (30 mg total) by mouth at bedtime. 11/21/17   Pucilowska, Braulio Conte B, MD  QUEtiapine (SEROQUEL) 200 MG tablet Take 1 tablet (200 mg total) by mouth at bedtime. 11/21/17   Pucilowska, Braulio Conte B, MD  traZODone (DESYREL) 100 MG tablet Take 1 tablet (100 mg total) by mouth at bedtime as needed for sleep. 11/21/17   Pucilowska, Braulio Conte B, MD  venlafaxine XR (EFFEXOR-XR) 150 MG 24 hr capsule Take 1 capsule (150 mg total) by mouth daily with breakfast. 11/22/17   Pucilowska, Ellin Goodie, MD    Allergies Patient has no known allergies.  Family History  Problem Relation Age of Onset  . Anxiety disorder Mother   . Depression Mother   . Anxiety disorder Father   . Depression Father   . Anxiety disorder Sister   . Depression Sister   . Anxiety disorder Brother   . Depression Brother   . Anxiety disorder Sister   . Depression Sister   . Anxiety disorder Sister   . Depression Sister   . Drug abuse Sister   . Anxiety disorder Sister   . Depression Sister   . Anxiety disorder Sister   . Depression Sister   . Anxiety  disorder Sister   . Depression Sister   . Anxiety disorder Sister   . Depression Sister   . Anxiety disorder Sister   . Depression Sister     Social History Social History   Tobacco Use  . Smoking status: Current Every Day Smoker    Packs/day: 1.00    Years: 10.00    Pack years: 10.00    Types: Cigarettes  . Smokeless tobacco: Never Used  . Tobacco comment: refused  Vaping Use  . Vaping Use: Never used  Substance Use Topics  . Alcohol use: Yes    Alcohol/week: 0.0 - 4.0 standard drinks    Comment: occassionally  . Drug use: Yes    Types: Marijuana    Comment: Daily    Review  of Systems  Constitutional: Negative for fever. Eyes: Negative for visual changes. ENT: Negative for sore throat. + dental pain Neck: No neck pain  Cardiovascular: Negative for chest pain. Respiratory: Negative for shortness of breath. Gastrointestinal: Negative for abdominal pain, vomiting or diarrhea. Genitourinary: Negative for dysuria. Musculoskeletal: Negative for back pain. Skin: Negative for rash. Neurological: Negative for headaches, weakness or numbness. Psych: No SI or HI  ____________________________________________   PHYSICAL EXAM:  VITAL SIGNS: ED Triage Vitals [05/07/20 0445]  Enc Vitals Group     BP 127/89     Pulse Rate 89     Resp 17     Temp 98.2 F (36.8 C)     Temp Source Oral     SpO2 99 %     Weight      Height      Head Circumference      Peak Flow      Pain Score 7     Pain Loc      Pain Edu?      Excl. in GC?     Constitutional: Alert and oriented, no distress. HEENT:      Head: Normocephalic and atraumatic.         Eyes: Conjunctivae are normal. Sclera is non-icteric.       Mouth/Throat: Mucous membranes are moist.  Oropharynx is clear, floor of the mouth is soft with no induration or tenderness, no trismus, no gum infection, no signs of abscess.  Left lower molar is tender to palpation with no obvious cavities or fractures, no signs of abscess.      Neck: Supple with no signs of meningismus. Cardiovascular: Regular rate and rhythm.  Respiratory: Normal respiratory effort. Musculoskeletal:  No edema, cyanosis, or erythema of extremities. Neurologic: Normal speech and language. Face is symmetric. Moving all extremities. No gross focal neurologic deficits are appreciated. Skin: Skin is warm, dry and intact. No rash noted. Psychiatric: Mood and affect are depressed  ____________________________________________   LABS (all labs ordered are listed, but only abnormal results are displayed)  Labs Reviewed - No data to  display ____________________________________________  EKG  none  ____________________________________________  RADIOLOGY  none ____________________________________________   PROCEDURES  Procedure(s) performed: None Procedures Critical Care performed:  None ____________________________________________   INITIAL IMPRESSION / ASSESSMENT AND PLAN / ED COURSE   25 y.o. male with a history of anxiety, depression, PTSD who presents via EMS for dental pain.  Patient has odd affect, does not make any eye contact, seems extremely anxious during the exam.  As I was asking him questions about the reason for his visit patient started tearing up.  I asked patient if there was another reason for the visit, if he was depressed  and he responded "I am okay."  Patient denies any other reason for being here.  Oral exam does not really show any signs of a tooth abscess or Ludewig's angina or cavities, or fractured teeth.  He is tender with palpation of one of the left lower molars.  Possible root canal infection.  Discussed with patient that he needs to see a dentist for this.  We will give him 800 mg of ibuprofen and viscous lidocaine.       _____________________________________________ Please note:  Patient was evaluated in Emergency Department today for the symptoms described in the history of present illness. Patient was evaluated in the context of the global COVID-19 pandemic, which necessitated consideration that the patient might be at risk for infection with the SARS-CoV-2 virus that causes COVID-19. Institutional protocols and algorithms that pertain to the evaluation of patients at risk for COVID-19 are in a state of rapid change based on information released by regulatory bodies including the CDC and federal and state organizations. These policies and algorithms were followed during the patient's care in the ED.  Some ED evaluations and interventions may be delayed as a result of limited  staffing during the pandemic.   [place] Controlled Substance Database was reviewed by me. ____________________________________________   FINAL CLINICAL IMPRESSION(S) / ED DIAGNOSES   Final diagnoses:  Pain, dental      NEW MEDICATIONS STARTED DURING THIS VISIT:  ED Discharge Orders    None       Note:  This document was prepared using Dragon voice recognition software and may include unintentional dictation errors.    Don Perking, [place], MD 05/07/20 [phone]

## 2020-06-09 ENCOUNTER — Emergency Department: Payer: Medicaid Other

## 2020-06-09 ENCOUNTER — Emergency Department
Admission: EM | Admit: 2020-06-09 | Discharge: 2020-06-09 | Disposition: A | Discharge status: Home / Self Care | Payer: Medicaid Other | Attending: Emergency Medicine | Admitting: Emergency Medicine

## 2020-06-09 ENCOUNTER — Other Ambulatory Visit: Payer: Self-pay

## 2020-06-09 ENCOUNTER — Encounter: Payer: Self-pay | Admitting: Emergency Medicine

## 2020-06-09 DIAGNOSIS — F17210 Nicotine dependence, cigarettes, uncomplicated: Secondary | ICD-10-CM | POA: Insufficient documentation

## 2020-06-09 DIAGNOSIS — N189 Chronic kidney disease, unspecified: Secondary | ICD-10-CM | POA: Insufficient documentation

## 2020-06-09 DIAGNOSIS — N201 Calculus of ureter: Secondary | ICD-10-CM

## 2020-06-09 DIAGNOSIS — N1330 Unspecified hydronephrosis: Secondary | ICD-10-CM | POA: Insufficient documentation

## 2020-06-09 DIAGNOSIS — R109 Unspecified abdominal pain: Secondary | ICD-10-CM

## 2020-06-09 HISTORY — DX: Calculus of kidney: N20.0

## 2020-06-09 LAB — BASIC METABOLIC PANEL
Anion gap: 8 (ref 5–15)
BUN: 24 mg/dL — ABNORMAL HIGH (ref 6–20)
CO2: 24 mmol/L (ref 22–32)
Calcium: 9.3 mg/dL (ref 8.9–10.3)
Chloride: 109 mmol/L (ref 98–111)
Creatinine, Ser: 0.84 mg/dL (ref 0.61–1.24)
GFR, Estimated: >60 mL/min (ref >60)
Glucose, Bld: 115 mg/dL — ABNORMAL HIGH (ref 70–99)
Potassium: 3.9 mmol/L (ref 3.5–5.1)
Sodium: 141 mmol/L (ref 135–145)

## 2020-06-09 LAB — URINALYSIS, COMPLETE (UACMP) WITH MICROSCOPIC
Bacteria, UA: NONE SEEN
Bilirubin Urine: NEGATIVE
Glucose, UA: NEGATIVE mg/dL
Hgb urine dipstick: NEGATIVE
Ketones, ur: NEGATIVE mg/dL
Leukocytes,Ua: NEGATIVE
Nitrite: NEGATIVE
Protein, ur: NEGATIVE mg/dL
Specific Gravity, Urine: 1.021 (ref 1.005–1.030)
pH: 6 (ref 5.0–8.0)

## 2020-06-09 LAB — CBC
HCT: 46.7 % (ref 39.0–52.0)
Hemoglobin: 16 g/dL (ref 13.0–17.0)
MCH: 31.3 pg (ref 26.0–34.0)
MCHC: 34.3 g/dL (ref 30.0–36.0)
MCV: 91.4 fL (ref 80.0–100.0)
Platelets: 211 10*3/uL (ref 150–400)
RBC: 5.11 MIL/uL (ref 4.22–5.81)
RDW: 13.2 % (ref 11.5–15.5)
WBC: 6.8 10*3/uL (ref 4.0–10.5)
nRBC: 0 % (ref 0.0–0.2)

## 2020-06-09 MED ORDER — ONDANSETRON HCL 4 MG/2ML IJ SOLN
4.0000 mg | Freq: Once | INTRAMUSCULAR | Status: AC
  Administered 2020-06-09: 4 mg via INTRAVENOUS
  Filled 2020-06-09: qty 2

## 2020-06-09 MED ORDER — ONDANSETRON 4 MG PO TBDP: 4.0000 mg | ORAL_TABLET | Freq: Three times a day (TID) | ORAL | 0 refills | Status: DC | PRN

## 2020-06-09 MED ORDER — OXYCODONE-ACETAMINOPHEN 5-325 MG PO TABS: 1.0000 | ORAL_TABLET | Freq: Four times a day (QID) | ORAL | 0 refills | Status: AC | PRN

## 2020-06-09 MED ORDER — FENTANYL CITRATE (PF) 100 MCG/2ML IJ SOLN
50.0000 ug | INTRAMUSCULAR | Status: AC | PRN
  Administered 2020-06-09 (×2): 50 ug via INTRAVENOUS
  Filled 2020-06-09 (×2): qty 2

## 2020-06-09 MED ORDER — TAMSULOSIN HCL 0.4 MG PO CAPS: 0.4000 mg | ORAL_CAPSULE | Freq: Every day | ORAL | 0 refills | Status: AC

## 2020-06-09 MED ORDER — KETOROLAC TROMETHAMINE 10 MG PO TABS: 10.0000 mg | ORAL_TABLET | Freq: Four times a day (QID) | ORAL | 0 refills | Status: DC | PRN

## 2020-06-09 MED ORDER — KETOROLAC TROMETHAMINE 30 MG/ML IJ SOLN
15.0000 mg | Freq: Once | INTRAMUSCULAR | Status: AC
  Administered 2020-06-09: 15 mg via INTRAVENOUS
  Filled 2020-06-09: qty 1

## 2020-06-09 NOTE — Discharge Instructions (Signed)
Call urology tomorrow for an appointment in the next 3 days.  Avoid taking Toradol, ibuprofen, or Aleve or naproxen after tomorrow until you have seen urology.

## 2020-06-09 NOTE — ED Triage Notes (Signed)
Pt arrived via [facility] from home with reports of R flank pain that started tonight, pt states he has hx of kidney stones 1 month ago on the L side.  Pt denies any hematuria. C/o nausea and vomited x 1.  Pt pacing in triage room.

## 2020-06-09 NOTE — ED Provider Notes (Signed)
[facility] Emergency Department Provider Note  ____________________________________________  Time seen: Approximately 6:36 AM  I have reviewed the triage vital signs and the nursing notes.   HISTORY  Chief Complaint Flank Pain   HPI Jimmy Richards is a 25 y.o. male with history of chronic kidney disease and kidney stones who presents for evaluation of flank pain.  Patient reports sudden onset of severe right-sided flank pain associated with nausea and vomiting that started an hour ago.  Pain is identical to prior episode of kidney stone.  No chest pain or shortness of breath, no abdominal pain, no fever or chills, no dysuria or hematuria.   Past Medical History:  Diagnosis Date  . Anxiety   . Chronic kidney disease   . Depression   . Kidney stones     Patient Active Problem List   Diagnosis Date Noted  . 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 Overdose 11/15/2017  . Tobacco use disorder 11/15/2017  . Generalized anxiety disorder 09/04/2015  . PTSD (post-traumatic stress disorder) 09/04/2015  . Cannabis use disorder, severe, dependence (HCC) 09/04/2015  . Adolescent idiopathic scoliosis of thoracic region 11/10/2014  . Recurrent oral herpes simplex 11/10/2014    No past surgical history on file.  Prior to Admission medications   Medication Sig Start Date End Date Taking? Authorizing Provider  amoxicillin (AMOXIL) 500 MG capsule Take 1 capsule (500 mg total) by mouth 3 (three) times daily. 12/13/18   Joni Reining, PA-C  busPIRone (BUSPAR) 10 MG tablet Take 1 tablet (10 mg total) by mouth 3 (three) times daily. 11/21/17   Pucilowska, Braulio Conte B, MD  ibuprofen (ADVIL) 600 MG tablet Take 1 tablet (600 mg total) by mouth every 8 (eight) hours as needed. 12/13/18   Joni Reining, PA-C  ketorolac (TORADOL) 10 MG tablet Take 1 tablet (10 mg total) by mouth every 6 (six) hours as needed for moderate  pain. 01/31/20   Sharman Cheek, MD  lidocaine (XYLOCAINE) 2 % solution Use as directed 5 mLs in the mouth or throat every 6 (six) hours as needed for mouth pain. 12/13/18   Joni Reining, PA-C  mirtazapine (REMERON) 30 MG tablet Take 1 tablet (30 mg total) by mouth at bedtime. 11/21/17   Pucilowska, Braulio Conte B, MD  QUEtiapine (SEROQUEL) 200 MG tablet Take 1 tablet (200 mg total) by mouth at bedtime. 11/21/17   Pucilowska, Braulio Conte B, MD  traZODone (DESYREL) 100 MG tablet Take 1 tablet (100 mg total) by mouth at bedtime as needed for sleep. 11/21/17   Pucilowska, Braulio Conte B, MD  venlafaxine XR (EFFEXOR-XR) 150 MG 24 hr capsule Take 1 capsule (150 mg total) by mouth daily with breakfast. 11/22/17   Pucilowska, Ellin Goodie, MD    Allergies Patient has no known allergies.  Family History  Problem Relation Age of Onset  . Anxiety disorder Mother   . Depression Mother   . Anxiety disorder Father   . Depression Father   . Anxiety disorder Sister   . Depression Sister   . Anxiety disorder Brother   . Depression Brother   . Anxiety disorder Sister   . Depression Sister   . Anxiety disorder Sister   . Depression Sister   . Drug abuse Sister   . Anxiety disorder Sister   . Depression Sister   . Anxiety disorder Sister   . Depression Sister   . Anxiety disorder Sister   . Depression Sister   .  Anxiety disorder Sister   . Depression Sister   . Anxiety disorder Sister   . Depression Sister     Social History Social History   Tobacco Use  . Smoking status: Current Every Day Smoker    Packs/day: 1.00    Years: 10.00    Pack years: 10.00    Types: Cigarettes  . Smokeless tobacco: Never Used  . Tobacco comment: refused  Vaping Use  . Vaping Use: Never used  Substance Use Topics  . Alcohol use: Yes    Alcohol/week: 0.0 - 4.0 standard drinks    Comment: occassionally  . Drug use: Yes    Types: Marijuana    Comment: Daily    Review of Systems  Constitutional: Negative for  fever. Eyes: Negative for visual changes. ENT: Negative for sore throat. Neck: No neck pain  Cardiovascular: Negative for chest pain. Respiratory: Negative for shortness of breath. Gastrointestinal: Negative for abdominal pain,  Diarrhea. + nausea, vomiting Genitourinary: Negative for dysuria. + R flank pain Musculoskeletal: Negative for back pain. Skin: Negative for rash. Neurological: Negative for headaches, weakness or numbness. Psych: No SI or HI  ____________________________________________   PHYSICAL EXAM:  VITAL SIGNS: ED Triage Vitals  Enc Vitals Group     BP 06/09/20 0529 130/84     Pulse Rate 06/09/20 0529 (!) 51     Resp 06/09/20 0529 18     Temp 06/09/20 0529 97.7 F (36.5 C)     Temp Source 06/09/20 0529 Oral     SpO2 06/09/20 0529 96 %     Weight 06/09/20 0519 140 lb (63.5 kg)     Height 06/09/20 0519 5\' 10"  (1.778 m)     Head Circumference --      Peak Flow --      Pain Score 06/09/20 0519 10     Pain Loc --      Pain Edu? --      Excl. in GC? --     Constitutional: Alert and oriented, pacing in the room, vomit all over the floor.  HEENT:      Head: Normocephalic and atraumatic.         Eyes: Conjunctivae are normal. Sclera is non-icteric.       Mouth/Throat: Mucous membranes are moist.       Neck: Supple with no signs of meningismus. Cardiovascular: Regular rate and rhythm. No murmurs, gallops, or rubs. 2+ symmetrical distal pulses are present in all extremities. No JVD. Respiratory: Normal respiratory effort. Lungs are clear to auscultation bilaterally.  Gastrointestinal: Soft, non tender, and non distended with positive bowel sounds. No rebound or guarding. Genitourinary: R CVA tenderness. Musculoskeletal:  No edema, cyanosis, or erythema of extremities. Neurologic: Normal speech and language. Face is symmetric. Moving all extremities. No gross focal neurologic deficits are appreciated. Skin: Skin is warm, dry and intact. No rash  noted. Psychiatric: Mood and affect are normal. Speech and behavior are normal.  ____________________________________________   LABS (all labs ordered are listed, but only abnormal results are displayed)  Labs Reviewed  URINALYSIS, COMPLETE (UACMP) WITH MICROSCOPIC - Abnormal; Notable for the following components:      Result Value   Color, Urine YELLOW (*)    APPearance CLEAR (*)    All other components within normal limits  BASIC METABOLIC PANEL - Abnormal; Notable for the following components:   Glucose, Bld 115 (*)    BUN 24 (*)    All other components within normal limits  CBC  ____________________________________________  EKG  none  ____________________________________________  RADIOLOGY  I have personally reviewed the images performed during this visit and I agree with the Radiologist's read.   Interpretation by Radiologist:  No results found.    ____________________________________________   PROCEDURES  Procedure(s) performed: None Procedures Critical Care performed:  None ____________________________________________   INITIAL IMPRESSION / ASSESSMENT AND PLAN / ED COURSE   25 y.o. male with history of chronic kidney disease and kidney stones who presents for evaluation of sudden onset of severe flank pain associated with nausea and vomiting.  Patient is pacing in the room due to pain, vomit everywhere, vitals are within normal limits, right flank tenderness with no abdominal tenderness.  Most likely kidney stone versus pyelonephritis versus UTI versus gallbladder pathology versus pancreatitis versus appendicitis.  Will treat with IV fentanyl, IV Toradol, IV Zofran.  We will get a renal ultrasound, basic blood work and urinalysis.  Old medical records reviewed including CT done in January consistent with a kidney stone.  _________________________ 7:15 AM on 06/09/2020 -----------------------------------------  UA with no signs of infection or blood.   Labs within normal limits.  Ultrasound pending.  Care transferred to Dr. Scotty Court.    _____________________________________________ Please note:  Patient was evaluated in Emergency Department today for the symptoms described in the history of present illness. Patient was evaluated in the context of the global COVID-19 pandemic, which necessitated consideration that the patient might be at risk for infection with the SARS-CoV-2 virus that causes COVID-19. Institutional protocols and algorithms that pertain to the evaluation of patients at risk for COVID-19 are in a state of rapid change based on information released by regulatory bodies including the CDC and federal and state organizations. These policies and algorithms were followed during the patient's care in the ED.  Some ED evaluations and interventions may be delayed as a result of limited staffing during the pandemic.   [place] Controlled Substance Database was reviewed by me. ____________________________________________   FINAL CLINICAL IMPRESSION(S) / ED DIAGNOSES   Final diagnoses:  Flank pain      NEW MEDICATIONS STARTED DURING THIS VISIT:  ED Discharge Orders    None       Note:  This document was prepared using Dragon voice recognition software and may include unintentional dictation errors.    Don Perking, [place], MD 06/09/20 [phone]

## 2020-06-09 NOTE — ED Provider Notes (Signed)
Procedures     ----------------------------------------- 9:48 AM on 06/09/2020 -----------------------------------------   Ultrasound shows mild to moderate hydronephrosis.  Urinalysis is normal, creatinine is normal.  Pain is now well controlled.  Start Flomax, pain control, follow-up with urology.   Sharman Cheek, MD 06/09/20 [phone]

## 2021-06-20 ENCOUNTER — Emergency Department: Payer: Medicaid Other

## 2021-06-20 ENCOUNTER — Other Ambulatory Visit: Payer: Self-pay

## 2021-06-20 ENCOUNTER — Emergency Department
Admission: EM | Admit: 2021-06-20 | Discharge: 2021-06-20 | Discharge status: Against Medical Advice | Payer: Medicaid Other | Attending: Emergency Medicine | Admitting: Emergency Medicine

## 2021-06-20 DIAGNOSIS — Z5321 Procedure and treatment not carried out due to patient leaving prior to being seen by health care provider: Secondary | ICD-10-CM | POA: Insufficient documentation

## 2021-06-20 DIAGNOSIS — R0781 Pleurodynia: Secondary | ICD-10-CM | POA: Insufficient documentation

## 2021-06-20 NOTE — ED Notes (Signed)
No answer when called several times from lobby 

## 2021-06-20 NOTE — ED Triage Notes (Signed)
Pt ambulatory to triage with steady gait. Pt reports " Ceiling came down on me while I was sleeping." Per EMS abrasions noted to back. Pt c/o left rib pain. Denies injury to head. Pt A &O in triage, sitting comfortably in chair with no signs of distress.

## 2022-11-09 IMAGING — CR DG RIBS W/ CHEST 3+V*L*
4 series · 4 of 4 positions shown · non-contrast
Comparison: None Available.

CLINICAL DATA: Injury, left rib pain.

EXAM:
LEFT RIBS AND CHEST - 3+ VIEW

[chest pa]
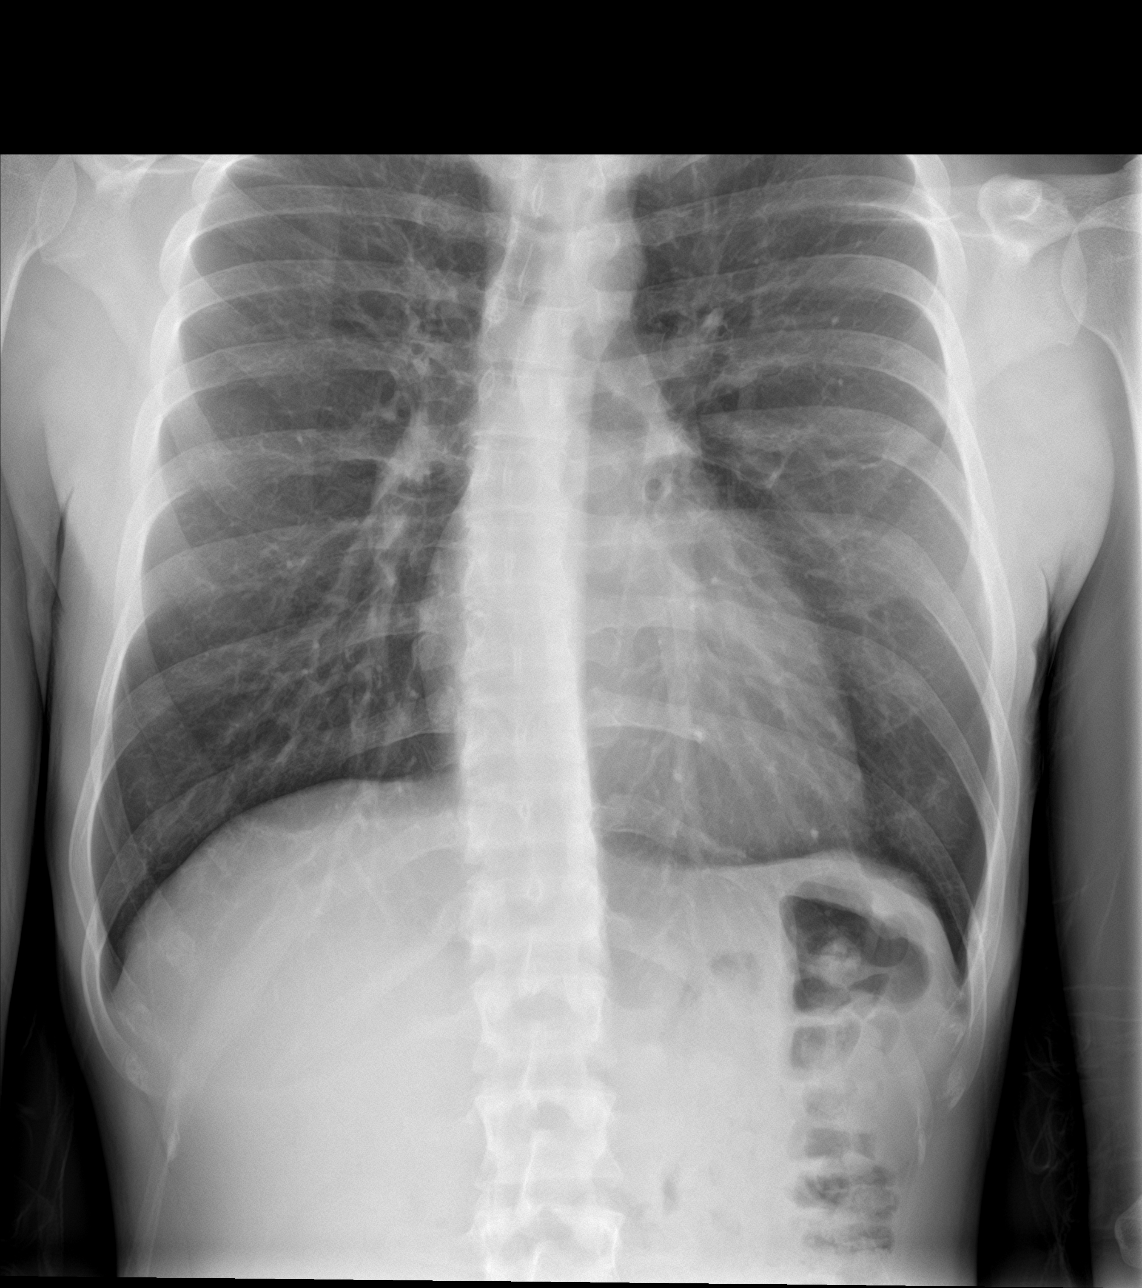

[rib pa]
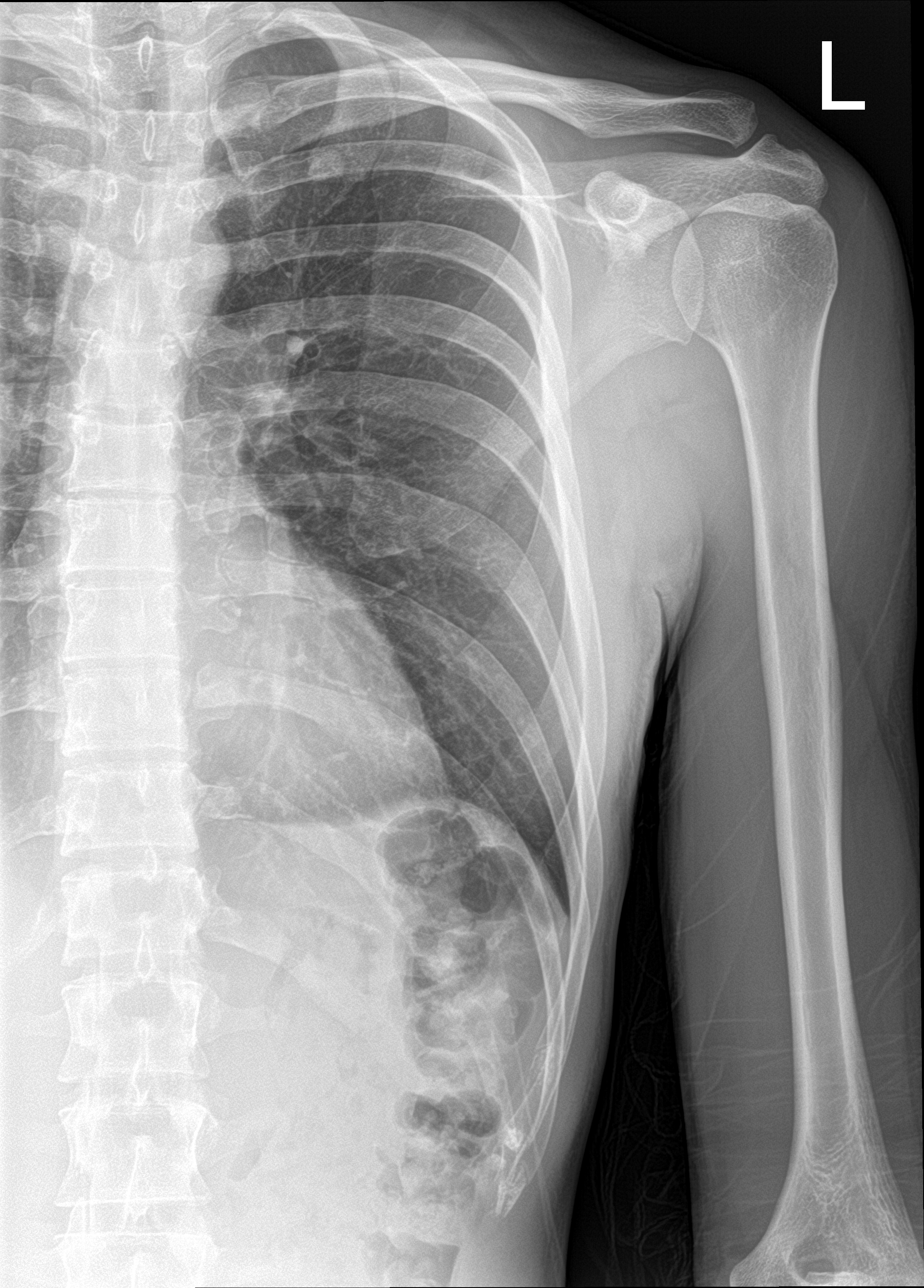

[rib pa obl]
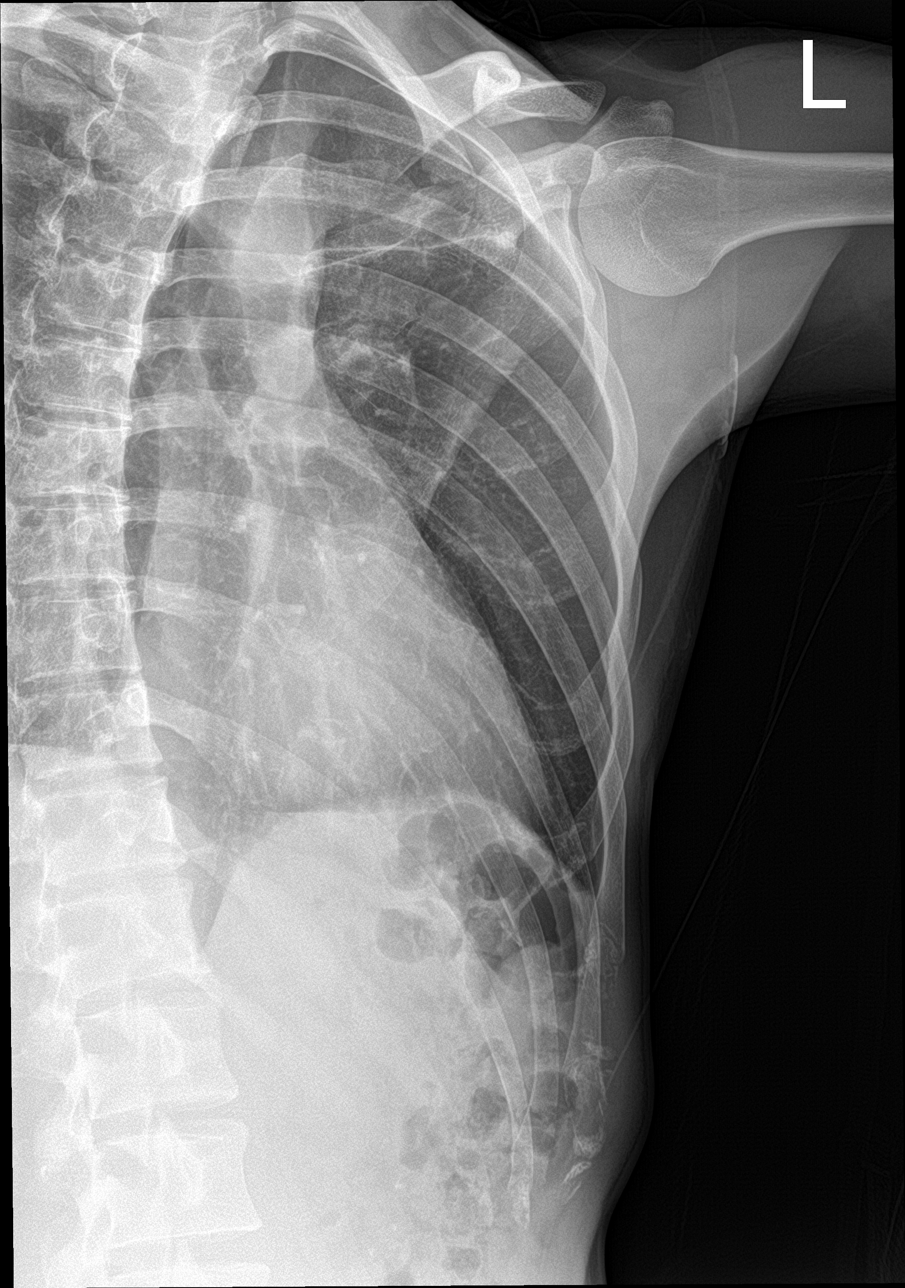

[rib ap obl]
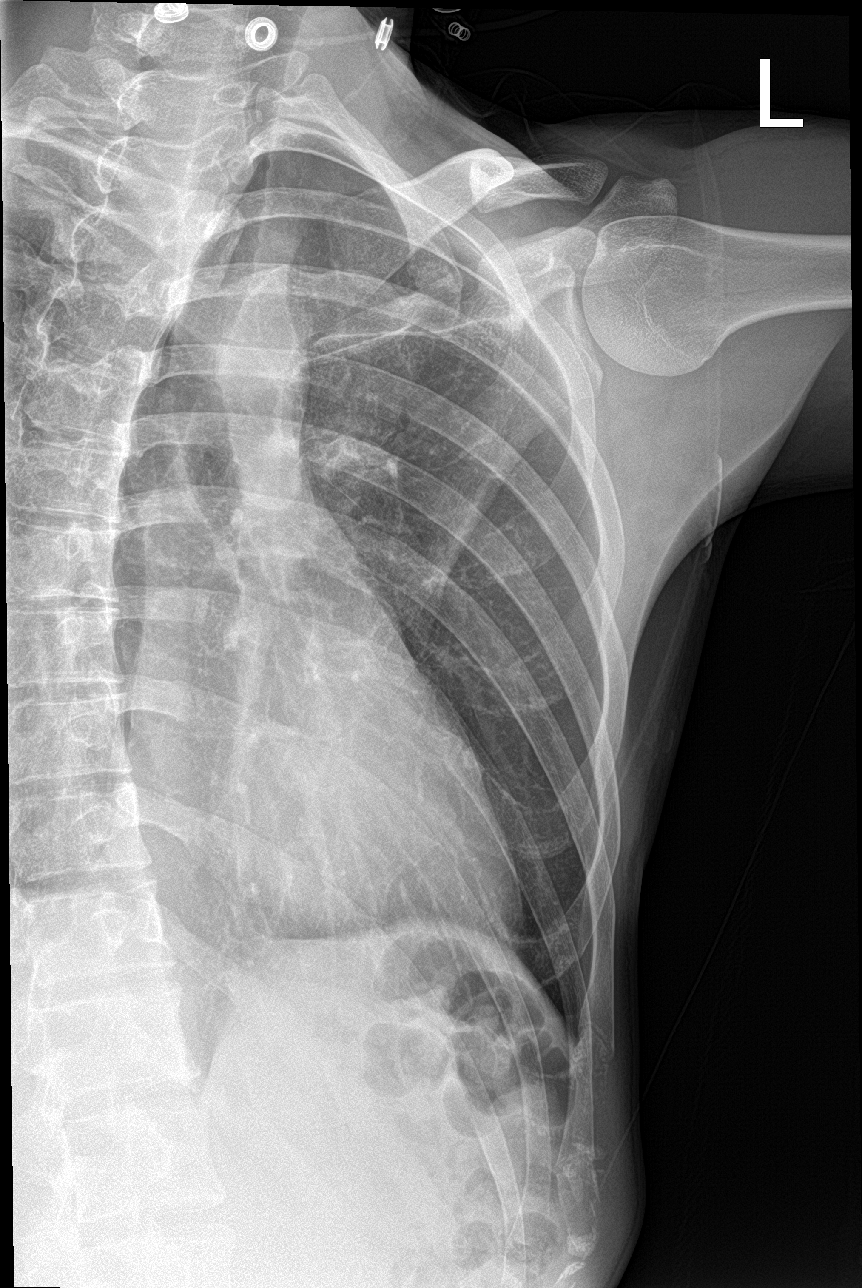

[4 of 4 positions shown; findings below may reference images not displayed]

FINDINGS: No fracture or other bone lesions are seen involving the ribs. There
is no evidence of pneumothorax or pleural effusion. Both lungs are
clear. Heart size and mediastinal contours are within normal limits.
Slight thoracic scoliosis.
IMPRESSION: Negative radiographs of the chest and left ribs.

## 2022-12-07 ENCOUNTER — Ambulatory Visit: Payer: BLUE CROSS/BLUE SHIELD | Admitting: Physician Assistant

## 2022-12-07 VITALS — BP 120/80 | HR 91 | Temp 98.5°F | Ht 70.0 in | Wt 140.0 lb

## 2022-12-07 DIAGNOSIS — K862 Cyst of pancreas: Secondary | ICD-10-CM | POA: Diagnosis not present

## 2022-12-07 DIAGNOSIS — Z87442 Personal history of urinary calculi: Secondary | ICD-10-CM | POA: Diagnosis not present

## 2022-12-07 LAB — POCT URINALYSIS DIPSTICK
Bilirubin, UA: NEGATIVE
Blood, UA: NEGATIVE
Glucose, UA: NEGATIVE
Leukocytes, UA: NEGATIVE
Nitrite, UA: NEGATIVE
Protein, UA: POSITIVE — AB
Spec Grav, UA: 1.01 (ref 1.010–1.025)
Urobilinogen, UA: 0.2 U/dL
pH, UA: 8 (ref 5.0–8.0)

## 2022-12-07 NOTE — Patient Instructions (Signed)
-  It was a pleasure to see you today! Please review your visit summary for helpful information -Lab results are usually available within 1-2 days and we will call once reviewed -I would encourage you to follow your care via MyChart where you can access lab results, notes, messages, and more -If you feel that we did a nice job today, please complete your after-visit survey and leave us a Google review! Your CMA today was Kieandra and your provider was Dan Waddell, PA-C, DMSc  

## 2022-12-07 NOTE — Telephone Encounter (Signed)
FYI  KP

## 2022-12-07 NOTE — Progress Notes (Unsigned)
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 [employer] at [phone].   Last kidney stone May 2022. No symptoms since. Has been working on good hydration.   Hx of multiple behavioral health conditions to include MDD, GAD, PTSD, cannabis use disorder, tobacco use disorder. These are all in remission without medication per patient. Has not used cannabis in about 1 year, and quit tobacco (cigars) as of 10/20/22.  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  The absence of epithelium precludes definitive characterization of the cyst.  Clinical correlation is recommended."  Medication list has been reviewed and updated.  No outpatient medications have been marked as taking for the 12/07/22 encounter (Office Visit) with Remo Lipps, PA.     Review of Systems  Constitutional:  Negative for fatigue and fever.  Respiratory:  Negative for chest tightness and shortness of breath.   Cardiovascular:  Negative for chest pain and palpitations.  Gastrointestinal:  Negative for abdominal pain.    Patient Active Problem List   Diagnosis Date Noted   History of depression 04/19/2019   History of nephrolithiasis 04/19/2019   Pancreatic cyst 12/27/2018   Major depression in remission (HCC) 11/16/2017   History of tobacco use  disorder 11/15/2017   History of anxiety disorder 09/04/2015   History of posttraumatic stress disorder (PTSD) 09/04/2015   Cannabis use disorder in remission 09/04/2015   Thoracic scoliosis 11/10/2014   Recurrent oral herpes simplex 11/10/2014    No Known Allergies  Immunization History  Administered Date(s) Administered   Fluzone Influenza virus vaccine,trivalent (IIV3), split virus 11/09/2014   Influenza-Unspecified 04/14/2013   Tdap 08/20/2006    History reviewed. No pertinent surgical history.  Social History   Tobacco Use   Smoking status: Former    Types: Cigars    Quit date: 10/20/2022    Years since quitting: 0.1    Passive exposure: Yes   Smokeless tobacco: Never   Tobacco comments:    refused  Vaping Use   Vaping status: Never Used  Substance Use Topics   Alcohol use: Yes    Alcohol/week: 0.0 - 4.0 standard drinks of alcohol    Comment: occassionally   Drug use: Not Currently    Types: Marijuana    Comment: history    Family History  Problem Relation Age of Onset   Hypertension Mother    Diabetes Mother    Anxiety disorder Mother    Depression Mother    Anxiety disorder Father    Depression Father    Anxiety disorder Sister    Depression Sister    Anxiety disorder Sister    Depression Sister    Anxiety disorder Sister    Depression Sister    Drug abuse Sister    Anxiety disorder Sister    Depression Sister    Anxiety disorder Sister    Depression Sister  Anxiety disorder Sister    Depression Sister    Anxiety disorder Sister    Depression Sister    Anxiety disorder Sister    Depression Sister    Anxiety disorder Brother    Depression Brother         12/07/2022    2:41 PM 12/07/2022    2:37 PM  GAD 7 : Generalized Anxiety Score  Nervous, Anxious, on Edge 0 0  Control/stop worrying 0 0  Worry too much - different things 0 0  Trouble relaxing 0 0  Restless 0 0  Easily annoyed or irritable 0 0  Afraid - awful might happen 0 0   Total GAD 7 Score 0 0  Anxiety Difficulty Not difficult at all Not difficult at all       12/07/2022    2:41 PM 12/07/2022    2:37 PM  Depression screen PHQ 2/9  Decreased Interest 0 0  Down, Depressed, Hopeless 0 0  PHQ - 2 Score 0 0  Altered sleeping 0 0  Tired, decreased energy 0 0  Change in appetite 0 0  Feeling bad or failure about yourself  0 0  Trouble concentrating 0 0  Moving slowly or fidgety/restless 0 0  Suicidal thoughts 0 0  PHQ-9 Score 0 0  Difficult doing work/chores Not difficult at all Not difficult at all    BP Readings from Last 3 Encounters:  12/07/22 120/80  06/20/21 131/64  06/09/20 104/73    Wt Readings from Last 3 Encounters:  12/07/22 140 lb (63.5 kg)  06/20/21 130 lb (59 kg)  06/09/20 140 lb (63.5 kg)    BP 120/80   Pulse 91   Temp 98.5 F (36.9 C) (Oral)   Ht 5\' 10"  (1.778 m)   Wt 140 lb (63.5 kg)   SpO2 98%   BMI 20.09 kg/m   Physical Exam Vitals and nursing note reviewed.  Constitutional:      Appearance: Normal appearance.  Cardiovascular:     Rate and Rhythm: Normal rate and regular rhythm.     Heart sounds: No murmur heard.    No friction rub. No gallop.  Pulmonary:     Effort: Pulmonary effort is normal.     Breath sounds: Normal breath sounds.  Abdominal:     General: There is no distension.  Musculoskeletal:        General: Normal range of motion.  Skin:    General: Skin is warm and dry.  Neurological:     Mental Status: He is alert and oriented to person, place, and time.     Gait: Gait is intact.  Psychiatric:        Mood and Affect: Mood and affect normal.       Recent Labs     Component Value Date/Time   NA 141 06/09/2020 0520   NA 133 08/11/2012 0337   K 3.9 06/09/2020 0520   K 3.8 08/11/2012 0337   CL 109 06/09/2020 0520   CL 100 08/11/2012 0337   CO2 24 06/09/2020 0520   CO2 29 (H) 08/11/2012 0337   GLUCOSE 115 (H) 06/09/2020 0520   GLUCOSE 98 08/11/2012 0337   BUN 24 (H) 06/09/2020 0520    BUN 14 08/11/2012 0337   CREATININE 0.84 06/09/2020 0520   CREATININE 0.84 08/11/2012 0337   CALCIUM 9.3 06/09/2020 0520   CALCIUM 9.4 08/11/2012 0337   PROT 7.9 11/15/2017 2003   PROT 8.5 08/11/2012 0337   ALBUMIN 4.7 11/15/2017 2003  ALBUMIN 4.1 08/11/2012 0337   AST 17 11/15/2017 2003   AST 18 08/11/2012 0337   ALT 16 11/15/2017 2003   ALT 19 08/11/2012 0337   ALKPHOS 55 11/15/2017 2003   ALKPHOS 166 (L) 08/11/2012 0337   BILITOT 0.8 11/15/2017 2003   BILITOT 0.7 08/11/2012 0337   GFRNONAA >60 06/09/2020 0520   GFRAA >60 03/07/2018 0706    Lab Results  Component Value Date   WBC 6.8 06/09/2020   HGB 16.0 06/09/2020   HCT 46.7 06/09/2020   MCV 91.4 06/09/2020   PLT 211 06/09/2020   Lab Results  Component Value Date   HGBA1C 4.9 11/15/2017   Lab Results  Component Value Date   CHOL 147 11/15/2017   HDL 36 (L) 11/15/2017   LDLCALC 95 11/15/2017   TRIG 79 11/15/2017   CHOLHDL 4.1 11/15/2017   Lab Results  Component Value Date   TSH 0.744 11/15/2017     Assessment and Plan:  1. History of nephrolithiasis *** - CBC with Differential/Platelet - Comprehensive metabolic panel - POCT urinalysis dipstick  2. Pancreatic cyst *** - CBC with Differential/Platelet - Comprehensive metabolic panel   Return if symptoms worsen or fail to improve.    Alvester Morin, PA-C, DMSc, Nutritionist [facility] [phone]

## 2022-12-08 LAB — CBC WITH DIFFERENTIAL/PLATELET
Basophils Absolute: 0.1 10*3/uL (ref 0.0–0.2)
Basos: 1 %
EOS (ABSOLUTE): 0.2 10*3/uL (ref 0.0–0.4)
Eos: 3 %
Hematocrit: 47.8 % (ref 37.5–51.0)
Hemoglobin: 15.9 g/dL (ref 13.0–17.7)
Immature Grans (Abs): 0 10*3/uL (ref 0.0–0.1)
Immature Granulocytes: 0 %
Lymphocytes Absolute: 2.1 10*3/uL (ref 0.7–3.1)
Lymphs: 32 %
MCH: 30.9 pg (ref 26.6–33.0)
MCHC: 33.3 g/dL (ref 31.5–35.7)
MCV: 93 fL (ref 79–97)
Monocytes Absolute: 0.7 10*3/uL (ref 0.1–0.9)
Monocytes: 11 %
Neutrophils Absolute: 3.5 10*3/uL (ref 1.4–7.0)
Neutrophils: 53 %
Platelets: 239 10*3/uL (ref 150–450)
RBC: 5.14 x10E6/uL (ref 4.14–5.80)
RDW: 12.2 % (ref 11.6–15.4)
WBC: 6.7 10*3/uL (ref 3.4–10.8)

## 2022-12-08 LAB — COMPREHENSIVE METABOLIC PANEL
ALT: 20 [IU]/L (ref 0–44)
AST: 16 [IU]/L (ref 0–40)
Albumin: 4.6 g/dL (ref 4.3–5.2)
Alkaline Phosphatase: 64 [IU]/L (ref 44–121)
BUN/Creatinine Ratio: 11 (ref 9–20)
BUN: 10 mg/dL (ref 6–20)
Bilirubin Total: 0.3 mg/dL (ref 0.0–1.2)
CO2: 24 mmol/L (ref 20–29)
Calcium: 9.2 mg/dL (ref 8.7–10.2)
Chloride: 105 mmol/L (ref 96–106)
Creatinine, Ser: 0.87 mg/dL (ref 0.76–1.27)
Globulin, Total: 2.2 g/dL (ref 1.5–4.5)
Glucose: 85 mg/dL (ref 70–99)
Potassium: 4 mmol/L (ref 3.5–5.2)
Sodium: 144 mmol/L (ref 134–144)
Total Protein: 6.8 g/dL (ref 6.0–8.5)
eGFR: 122 mL/min/{1.73_m2} (ref >59)

## 2022-12-15 ENCOUNTER — Ambulatory Visit
Admission: RE | Admit: 2022-12-15 | Discharge: 2022-12-15 | Disposition: A | Discharge status: Home / Self Care | Payer: BLUE CROSS/BLUE SHIELD | Source: Ambulatory Visit | Attending: Physician Assistant | Admitting: Physician Assistant

## 2022-12-15 ENCOUNTER — Encounter: Payer: Self-pay | Admitting: Physician Assistant

## 2022-12-15 DIAGNOSIS — Z87442 Personal history of urinary calculi: Secondary | ICD-10-CM | POA: Insufficient documentation

## 2023-12-08 ENCOUNTER — Telehealth: Payer: Self-pay

## 2023-12-08 ENCOUNTER — Ambulatory Visit
Admission: EM | Admit: 2023-12-08 | Discharge: 2023-12-08 | Disposition: A | Discharge status: Home / Self Care | Attending: Emergency Medicine | Admitting: Emergency Medicine

## 2023-12-08 DIAGNOSIS — L0591 Pilonidal cyst without abscess: Secondary | ICD-10-CM

## 2023-12-08 MED ORDER — DOXYCYCLINE HYCLATE 100 MG PO CAPS: 100.0000 mg | ORAL_CAPSULE | Freq: Two times a day (BID) | ORAL | 0 refills | Status: AC

## 2023-12-08 NOTE — Discharge Instructions (Addendum)
 Take the antibiotic as directed.  Apply warm compresses as directed.    Follow-up with your primary care provider or a general surgeon such as the one listed below.  Go to the emergency department if you have worsening symptoms.

## 2023-12-08 NOTE — ED Provider Notes (Signed)
 Jimmy Richards    CSN: [id number] Arrival date & time: 12/08/23  1326      History   Chief Complaint Chief Complaint  Patient presents with   Abscess    HPI Dnaiel Voller is a 28 y.o. male.  Patient presents with tender red swollen abscess at the top of his gluteal cleft x 1 week.  No open wounds or drainage.  No fever, chills, difficulty with bowel movements, blood in stool, numbness, weakness.  He has been taking Tylenol .  Patient reports history of similar symptoms with a pilonidal cyst approximately 6 years ago.  The history is provided by the patient and medical records.    Past Medical History:  Diagnosis Date   Anxiety    Chronic kidney disease    Depression    GERD (gastroesophageal reflux disease)    Insomnia    Kidney stones     Patient Active Problem List   Diagnosis Date Noted   History of depression 04/19/2019   Bilateral nephrolithiasis 04/19/2019   Pancreatic cyst 12/27/2018   Major depression in remission 11/16/2017   History of tobacco use disorder 11/15/2017   History of anxiety disorder 09/04/2015   History of posttraumatic stress disorder (PTSD) 09/04/2015   Cannabis use disorder in remission 09/04/2015   Thoracic scoliosis 11/10/2014   Recurrent oral herpes simplex 11/10/2014    History reviewed. No pertinent surgical history.     Home Medications    Prior to Admission medications   Medication Sig Start Date End Date Taking? Authorizing Provider  doxycycline  (VIBRAMYCIN ) 100 MG capsule Take 1 capsule (100 mg total) by mouth 2 (two) times daily for 7 days. 12/08/23 12/15/23 Yes Corlis Burnard DEL, NP    Family History Family History  Problem Relation Age of Onset   Hypertension Mother    Diabetes Mother    Anxiety disorder Mother    Depression Mother    Anxiety disorder Father    Depression Father    Anxiety disorder Sister    Depression Sister    Anxiety disorder Sister    Depression Sister    Anxiety disorder Sister     Depression Sister    Drug abuse Sister    Anxiety disorder Sister    Depression Sister    Anxiety disorder Sister    Depression Sister    Anxiety disorder Sister    Depression Sister    Anxiety disorder Sister    Depression Sister    Anxiety disorder Sister    Depression Sister    Anxiety disorder Brother    Depression Brother     Social History Social History   Tobacco Use   Smoking status: Former    Types: Cigars    Quit date: 10/20/2022    Years since quitting: 1.1    Passive exposure: Yes   Smokeless tobacco: Never   Tobacco comments:    refused  Vaping Use   Vaping status: Never Used  Substance Use Topics   Alcohol use: Yes    Alcohol/week: 0.0 - 4.0 standard drinks of alcohol    Comment: occassionally   Drug use: Not Currently    Types: Marijuana    Comment: history     Allergies   Patient has no known allergies.   Review of Systems Review of Systems  Constitutional:  Negative for chills and fever.  Musculoskeletal:  Negative for back pain and gait problem.  Skin:  Positive for color change and wound.  Neurological:  Negative for weakness  and numbness.     Physical Exam Triage Vital Signs ED Triage Vitals  Encounter Vitals Group     BP 12/08/23 1604 119/78     Girls Systolic BP Percentile --      Girls Diastolic BP Percentile --      Boys Systolic BP Percentile --      Boys Diastolic BP Percentile --      Pulse Rate 12/08/23 1604 91     Resp 12/08/23 1604 18     Temp 12/08/23 1604 97.9 F (36.6 C)     Temp src --      SpO2 12/08/23 1604 98 %     Weight --      Height --      Head Circumference --      Peak Flow --      Pain Score 12/08/23 1602 7     Pain Loc --      Pain Education --      Exclude from Growth Chart --    No data found.  Updated Vital Signs BP 119/78   Pulse 91   Temp 97.9 F (36.6 C)   Resp 18   SpO2 98%   Visual Acuity Right Eye Distance:   Left Eye Distance:   Bilateral Distance:    Right Eye Near:    Left Eye Near:    Bilateral Near:     Physical Exam Constitutional:      General: He is not in acute distress. HENT:     Mouth/Throat:     Mouth: Mucous membranes are moist.  Cardiovascular:     Rate and Rhythm: Normal rate.  Pulmonary:     Effort: Pulmonary effort is normal. No respiratory distress.  Skin:    General: Skin is warm and dry.     Findings: Erythema present.     Comments: Gluteal cleft has a lightly erythematous, mildly edematous tender area that is firm and nonfluctuant.  No open wounds or drainage.  Neurological:     Mental Status: He is alert.      UC Treatments / Results  Labs (all labs ordered are listed, but only abnormal results are displayed) Labs Reviewed - No data to display  EKG   Radiology No results found.  Procedures Procedures (including critical care time)  Medications Ordered in UC Medications - No data to display  Initial Impression / Assessment and Plan / UC Course  I have reviewed the triage vital signs and the nursing notes.  Pertinent labs & imaging results that were available during my care of the patient were reviewed by me and considered in my medical decision making (see chart for details).    Pilonidal cyst.  Afebrile and vital signs are stable.  No indication for I&D at this time.  Treating with doxycycline.  Warm compresses.  Education provided on pilonidal cyst.  Instructed patient to follow-up with his PCP or general surgeon.  Contact information for on-call surgeon provided.  ED and return precautions given.  Patient agrees to plan of care.  Final Clinical Impressions(s) / UC Diagnoses   Final diagnoses:  Pilonidal cyst     Discharge Instructions      Take the antibiotic as directed.  Apply warm compresses as directed.    Follow-up with your primary care provider or a general surgeon such as the one listed below.  Go to the emergency department if you have worsening symptoms.     ED Prescriptions  Medication Sig Dispense Auth. Provider   doxycycline  (VIBRAMYCIN ) 100 MG capsule Take 1 capsule (100 mg total) by mouth 2 (two) times daily for 7 days. 14 capsule Corlis Burnard DEL, NP      PDMP not reviewed this encounter.   Corlis Burnard DEL, NP 12/08/23 [phone]

## 2023-12-08 NOTE — Telephone Encounter (Signed)
 Attempted to call [phone]

## 2023-12-08 NOTE — Telephone Encounter (Signed)
 Patient was attempted to be called to let patient know we were ready for their appointment. Call could not be completed.

## 2023-12-08 NOTE — ED Notes (Signed)
Patient called x 3 without answer 

## 2023-12-08 NOTE — ED Triage Notes (Signed)
 Patient to Urgent Care with complaints of abscess to his tailbone- area red/ swollen/ inflamed. No drainage.  Symptoms x1 week.   Using tylenol  w/o relief.

## 2023-12-15 ENCOUNTER — Encounter: Payer: Self-pay | Admitting: Emergency Medicine

## 2023-12-15 ENCOUNTER — Ambulatory Visit
Admission: EM | Admit: 2023-12-15 | Discharge: 2023-12-15 | Disposition: A | Discharge status: Home / Self Care | Attending: Emergency Medicine | Admitting: Emergency Medicine

## 2023-12-15 DIAGNOSIS — L0501 Pilonidal cyst with abscess: Secondary | ICD-10-CM | POA: Diagnosis not present

## 2023-12-15 MED ORDER — SULFAMETHOXAZOLE-TRIMETHOPRIM 800-160 MG PO TABS: 1.0000 | ORAL_TABLET | Freq: Two times a day (BID) | ORAL | 0 refills | Status: AC

## 2023-12-15 NOTE — ED Notes (Signed)
 Patient phone was called 3 times no answer.

## 2023-12-15 NOTE — ED Triage Notes (Signed)
 Patient reports abscess to his tailbone area. Patient complains of pain  and swelling. No drainage. Rates pain 8/10. Patient took Ibuprofen  today with mild relief.

## 2023-12-15 NOTE — ED Provider Notes (Signed)
 Jimmy Richards    CSN: [id number] Arrival date & time: 12/15/23  1452      History   Chief Complaint Chief Complaint  Patient presents with   Abscess    HPI Jimmy Richards is a 28 y.o. male.   Patient presents for evaluation of abscess present to the tailbone present for 2 weeks.  Was evaluated in this urgent care on 12/08/2023, prescribed doxycycline , endorses symptoms that would not resolve and abscess has increased in size causing pain.  Denies drainage or fever.  Past Medical History:  Diagnosis Date   Anxiety    Chronic kidney disease    Depression    GERD (gastroesophageal reflux disease)    Insomnia    Kidney stones     Patient Active Problem List   Diagnosis Date Noted   History of depression 04/19/2019   Bilateral nephrolithiasis 04/19/2019   Pancreatic cyst 12/27/2018   Major depression in remission 11/16/2017   History of tobacco use disorder 11/15/2017   History of anxiety disorder 09/04/2015   History of posttraumatic stress disorder (PTSD) 09/04/2015   Cannabis use disorder in remission 09/04/2015   Thoracic scoliosis 11/10/2014   Recurrent oral herpes simplex 11/10/2014    History reviewed. No pertinent surgical history.     Home Medications    Prior to Admission medications   Medication Sig Start Date End Date Taking? Authorizing Provider  sulfamethoxazole -trimethoprim  (BACTRIM  DS) 800-160 MG tablet Take 1 tablet by mouth 2 (two) times daily for 7 days. 12/15/23 12/22/23 Yes Suhana Wilner R, NP  doxycycline  (VIBRAMYCIN ) 100 MG capsule Take 1 capsule (100 mg total) by mouth 2 (two) times daily for 7 days. 12/08/23 12/15/23  Corlis Burnard DEL, NP    Family History Family History  Problem Relation Age of Onset   Hypertension Mother    Diabetes Mother    Anxiety disorder Mother    Depression Mother    Anxiety disorder Father    Depression Father    Anxiety disorder Sister    Depression Sister    Anxiety disorder Sister     Depression Sister    Anxiety disorder Sister    Depression Sister    Drug abuse Sister    Anxiety disorder Sister    Depression Sister    Anxiety disorder Sister    Depression Sister    Anxiety disorder Sister    Depression Sister    Anxiety disorder Sister    Depression Sister    Anxiety disorder Sister    Depression Sister    Anxiety disorder Brother    Depression Brother     Social History Social History   Tobacco Use   Smoking status: Former    Types: Cigars    Quit date: 10/20/2022    Years since quitting: 1.1    Passive exposure: Yes   Smokeless tobacco: Never   Tobacco comments:    refused  Vaping Use   Vaping status: Never Used  Substance Use Topics   Alcohol use: Yes    Alcohol/week: 0.0 - 4.0 standard drinks of alcohol    Comment: occassionally   Drug use: Not Currently    Types: Marijuana    Comment: history     Allergies   Patient has no known allergies.   Review of Systems Review of Systems   Physical Exam Triage Vital Signs ED Triage Vitals  Encounter Vitals Group     BP 12/15/23 1530 115/74     Girls Systolic BP Percentile --  Girls Diastolic BP Percentile --      Boys Systolic BP Percentile --      Boys Diastolic BP Percentile --      Pulse Rate 12/15/23 1530 94     Resp 12/15/23 1530 20     Temp 12/15/23 1530 98.5 F (36.9 C)     Temp Source 12/15/23 1530 Oral     SpO2 12/15/23 1530 98 %     Weight --      Height --      Head Circumference --      Peak Flow --      Pain Score 12/15/23 1532 8     Pain Loc --      Pain Education --      Exclude from Growth Chart --    No data found.  Updated Vital Signs BP 115/74 (BP Location: Left Arm)   Pulse 94   Temp 98.5 F (36.9 C) (Oral)   Resp 20   SpO2 98%   Visual Acuity Right Eye Distance:   Left Eye Distance:   Bilateral Distance:    Right Eye Near:   Left Eye Near:    Bilateral Near:     Physical Exam Constitutional:      Appearance: Normal appearance.   Eyes:     Extraocular Movements: Extraocular movements intact.  Pulmonary:     Effort: Pulmonary effort is normal.  Skin:    Comments: 1 x 2 erythematous and tender pilonidal cyst  Neurological:     Mental Status: He is alert and oriented to person, place, and time.      UC Treatments / Results  Labs (all labs ordered are listed, but only abnormal results are displayed) Labs Reviewed - No data to display  EKG   Radiology No results found.  Procedures Incision and Drainage  Date/Time: 12/15/2023 4:16 PM  Performed by: Teresa Shelba SAUNDERS, NP Authorized by: Teresa Shelba SAUNDERS, NP   Consent:    Consent obtained:  Verbal   Consent given by:  Patient   Risks discussed:  Incomplete drainage and bleeding Universal protocol:    Patient identity confirmed:  Verbally with patient Location:    Type:  Abscess   Size:  1x2   Location:  Anogenital   Anogenital location:  Pilonidal Pre-procedure details:    Skin preparation:  Chlorhexidine with alcohol Anesthesia:    Anesthesia method:  Local infiltration   Local anesthetic:  Lidocaine  1% w/o epi Procedure type:    Complexity:  Simple Procedure details:    Incision types:  Single straight   Drainage:  Purulent   Drainage amount:  Moderate   Wound treatment:  Wound left open   Packing materials:  None Post-procedure details:    Procedure completion:  Tolerated  (including critical care time)  Medications Ordered in UC Medications - No data to display  Initial Impression / Assessment and Plan / UC Course  I have reviewed the triage vital signs and the nursing notes.  Pertinent labs & imaging results that were available during my care of the patient were reviewed by me and considered in my medical decision making (see chart for details).  Pilonidal abscess  I&D completed, able to expel purulent drainage, tolerated well, prescribed Bactrim  as symptoms have not resolved with course of doxycycline  recommended warm  compresses and over-the-counter analgesics advised follow-up for nonhealing site Final Clinical Impressions(s) / UC Diagnoses   Final diagnoses:  Pilonidal abscess     Discharge Instructions  Abscess was drained here in the clinic, able to expel some greenish-yellow puslike drainage  Take Bactrim  twice daily for 7 days to ensure symptoms fully clear up  Hold warm-hot compresses to affected area at least 4 times a day, this helps to facilitate draining, the more the better  Please return for evaluation for increased swelling, increased tenderness or pain, non healing site, non draining site, you begin to have fever or chills   We reviewed the etiology of recurrent abscesses of skin.  Skin abscesses are collections of pus within the dermis and deeper skin tissues. Skin abscesses manifest as painful, tender, fluctuant, and erythematous nodules, frequently surmounted by a pustule and surrounded by a rim of erythematous swelling.  Spontaneous drainage of purulent material may occur.  Fever can occur on occasion.    -Skin abscesses can develop in healthy individuals with no predisposing conditions other than skin or nasal carriage of Staphylococcus aureus.  Individuals in close contact with others who have active infection with skin abscesses are at increased risk which is likely to explain why twin brother has similar episodes.   In addition, any process leading to a breach in the skin barrier can also predispose to the development of a skin abscesses, such as atopic dermatitis.      ED Prescriptions     Medication Sig Dispense Auth. Provider   sulfamethoxazole -trimethoprim  (BACTRIM  DS) 800-160 MG tablet Take 1 tablet by mouth 2 (two) times daily for 7 days. 14 tablet Zadiel Leyh R, NP      PDMP not reviewed this encounter.   Teresa Shelba SAUNDERS, [place] 12/15/23 [phone]

## 2023-12-15 NOTE — Discharge Instructions (Signed)
 Abscess was drained here in the clinic, able to expel some greenish-yellow puslike drainage  Take Bactrim  twice daily for 7 days to ensure symptoms fully clear up  Hold warm-hot compresses to affected area at least 4 times a day, this helps to facilitate draining, the more the better  Please return for evaluation for increased swelling, increased tenderness or pain, non healing site, non draining site, you begin to have fever or chills   We reviewed the etiology of recurrent abscesses of skin.  Skin abscesses are collections of pus within the dermis and deeper skin tissues. Skin abscesses manifest as painful, tender, fluctuant, and erythematous nodules, frequently surmounted by a pustule and surrounded by a rim of erythematous swelling.  Spontaneous drainage of purulent material may occur.  Fever can occur on occasion.    -Skin abscesses can develop in healthy individuals with no predisposing conditions other than skin or nasal carriage of Staphylococcus aureus.  Individuals in close contact with others who have active infection with skin abscesses are at increased risk which is likely to explain why twin brother has similar episodes.   In addition, any process leading to a breach in the skin barrier can also predispose to the development of a skin abscesses, such as atopic dermatitis.
